# Patient Record
Sex: Female | Born: 1935 | Race: White | Hispanic: No | Smoking: Never smoker
Health system: Southern US, Community
[De-identification: ages and names within clinical notes are randomized; demographics above are authoritative.]

## PROBLEM LIST (undated history)

## (undated) DIAGNOSIS — I10 Essential (primary) hypertension: Secondary | ICD-10-CM

## (undated) DIAGNOSIS — Z598 Other problems related to housing and economic circumstances: Secondary | ICD-10-CM

## (undated) DIAGNOSIS — M8580 Other specified disorders of bone density and structure, unspecified site: Secondary | ICD-10-CM

## (undated) DIAGNOSIS — K5730 Diverticulosis of large intestine without perforation or abscess without bleeding: Secondary | ICD-10-CM

## (undated) DIAGNOSIS — F419 Anxiety disorder, unspecified: Secondary | ICD-10-CM

## (undated) DIAGNOSIS — Z5989 Other problems related to housing and economic circumstances: Secondary | ICD-10-CM

## (undated) DIAGNOSIS — F329 Major depressive disorder, single episode, unspecified: Secondary | ICD-10-CM

## (undated) DIAGNOSIS — Z5987 Material hardship due to limited financial resources, not elsewhere classified: Secondary | ICD-10-CM

## (undated) DIAGNOSIS — F32A Depression, unspecified: Secondary | ICD-10-CM

## (undated) DIAGNOSIS — D126 Benign neoplasm of colon, unspecified: Secondary | ICD-10-CM

## (undated) HISTORY — DX: Anxiety disorder, unspecified: F41.9

## (undated) HISTORY — DX: Benign neoplasm of colon, unspecified: D12.6

## (undated) HISTORY — DX: Depression, unspecified: F32.A

## (undated) HISTORY — DX: Major depressive disorder, single episode, unspecified: F32.9

## (undated) HISTORY — DX: Other problems related to housing and economic circumstances: Z59.8

## (undated) HISTORY — DX: Other problems related to housing and economic circumstances: Z59.89

## (undated) HISTORY — DX: Essential (primary) hypertension: I10

## (undated) HISTORY — PX: ABDOMINAL HYSTERECTOMY: SHX81

## (undated) HISTORY — DX: Other specified disorders of bone density and structure, unspecified site: M85.80

## (undated) HISTORY — DX: Material hardship due to limited financial resources, not elsewhere classified: Z59.87

## (undated) HISTORY — DX: Diverticulosis of large intestine without perforation or abscess without bleeding: K57.30

---

## 2000-04-14 ENCOUNTER — Ambulatory Visit (HOSPITAL_COMMUNITY): Admission: RE | Admit: 2000-04-14 | Discharge: 2000-04-14 | Payer: Self-pay

## 2000-04-21 ENCOUNTER — Ambulatory Visit (HOSPITAL_COMMUNITY): Admission: RE | Admit: 2000-04-21 | Discharge: 2000-04-21 | Payer: Self-pay | Admitting: Internal Medicine

## 2000-04-21 ENCOUNTER — Encounter: Admission: RE | Admit: 2000-04-21 | Discharge: 2000-04-21 | Payer: Self-pay | Admitting: Internal Medicine

## 2001-10-04 ENCOUNTER — Encounter: Admission: RE | Admit: 2001-10-04 | Discharge: 2001-10-04 | Payer: Self-pay | Admitting: Internal Medicine

## 2001-10-28 ENCOUNTER — Encounter: Admission: RE | Admit: 2001-10-28 | Discharge: 2001-10-28 | Payer: Self-pay | Admitting: Internal Medicine

## 2001-11-25 ENCOUNTER — Encounter: Admission: RE | Admit: 2001-11-25 | Discharge: 2001-11-25 | Payer: Self-pay | Admitting: Internal Medicine

## 2002-02-15 ENCOUNTER — Encounter: Admission: RE | Admit: 2002-02-15 | Discharge: 2002-02-15 | Payer: Self-pay | Admitting: Internal Medicine

## 2002-02-22 ENCOUNTER — Encounter: Admission: RE | Admit: 2002-02-22 | Discharge: 2002-02-22 | Payer: Self-pay | Admitting: Internal Medicine

## 2002-02-27 ENCOUNTER — Encounter: Admission: RE | Admit: 2002-02-27 | Discharge: 2002-02-27 | Payer: Self-pay | Admitting: Internal Medicine

## 2002-03-06 ENCOUNTER — Encounter: Admission: RE | Admit: 2002-03-06 | Discharge: 2002-03-06 | Payer: Self-pay | Admitting: Internal Medicine

## 2002-03-22 ENCOUNTER — Encounter: Admission: RE | Admit: 2002-03-22 | Discharge: 2002-03-22 | Payer: Self-pay | Admitting: Internal Medicine

## 2003-05-21 ENCOUNTER — Encounter: Admission: RE | Admit: 2003-05-21 | Discharge: 2003-05-21 | Payer: Self-pay | Admitting: Internal Medicine

## 2004-01-03 ENCOUNTER — Encounter: Admission: RE | Admit: 2004-01-03 | Discharge: 2004-01-03 | Payer: Self-pay | Admitting: Internal Medicine

## 2005-09-17 ENCOUNTER — Ambulatory Visit: Payer: Self-pay | Admitting: Internal Medicine

## 2005-10-01 ENCOUNTER — Ambulatory Visit: Payer: Self-pay | Admitting: Internal Medicine

## 2005-10-28 ENCOUNTER — Ambulatory Visit: Payer: Self-pay | Admitting: Internal Medicine

## 2006-08-23 ENCOUNTER — Encounter (INDEPENDENT_AMBULATORY_CARE_PROVIDER_SITE_OTHER): Payer: Self-pay | Admitting: Internal Medicine

## 2006-08-23 ENCOUNTER — Ambulatory Visit: Payer: Self-pay | Admitting: Internal Medicine

## 2006-08-23 ENCOUNTER — Ambulatory Visit (HOSPITAL_COMMUNITY): Admission: RE | Admit: 2006-08-23 | Discharge: 2006-08-23 | Payer: Self-pay | Admitting: *Deleted

## 2006-08-23 DIAGNOSIS — F329 Major depressive disorder, single episode, unspecified: Secondary | ICD-10-CM

## 2006-08-23 DIAGNOSIS — I10 Essential (primary) hypertension: Secondary | ICD-10-CM | POA: Insufficient documentation

## 2006-08-23 DIAGNOSIS — R079 Chest pain, unspecified: Secondary | ICD-10-CM | POA: Insufficient documentation

## 2006-08-23 DIAGNOSIS — J329 Chronic sinusitis, unspecified: Secondary | ICD-10-CM | POA: Insufficient documentation

## 2006-08-23 DIAGNOSIS — F411 Generalized anxiety disorder: Secondary | ICD-10-CM | POA: Insufficient documentation

## 2006-08-23 LAB — CONVERTED CEMR LAB
BUN: 26 mg/dL — ABNORMAL HIGH (ref 6–23)
CO2: 27 meq/L (ref 19–32)
Calcium: 9.3 mg/dL (ref 8.4–10.5)
Chloride: 103 meq/L (ref 96–112)
Creatinine, Ser: 0.84 mg/dL (ref 0.40–1.20)
Glucose, Bld: 138 mg/dL — ABNORMAL HIGH (ref 70–99)
HCT: 45.3 % (ref 36.0–46.0)
Hemoglobin: 15.5 g/dL — ABNORMAL HIGH (ref 12.0–15.0)
MCHC: 34.2 g/dL (ref 30.0–36.0)
MCV: 89.5 fL (ref 78.0–100.0)
Platelets: 285 10*3/uL (ref 150–400)
Potassium: 4 meq/L (ref 3.5–5.3)
RBC: 5.06 M/uL (ref 3.87–5.11)
RDW: 13.5 % (ref 11.5–14.0)
Sodium: 143 meq/L (ref 135–145)
WBC: 8.8 10*3/uL (ref 4.0–10.5)

## 2006-08-31 ENCOUNTER — Ambulatory Visit (HOSPITAL_COMMUNITY): Admission: RE | Admit: 2006-08-31 | Discharge: 2006-08-31 | Payer: Self-pay | Admitting: Internal Medicine

## 2006-08-31 ENCOUNTER — Encounter (INDEPENDENT_AMBULATORY_CARE_PROVIDER_SITE_OTHER): Payer: Self-pay | Admitting: Cardiology

## 2006-09-06 ENCOUNTER — Encounter (INDEPENDENT_AMBULATORY_CARE_PROVIDER_SITE_OTHER): Payer: Self-pay | Admitting: Internal Medicine

## 2006-09-06 ENCOUNTER — Ambulatory Visit: Payer: Self-pay | Admitting: Hospitalist

## 2006-09-07 LAB — CONVERTED CEMR LAB
Calcium: 8.9 mg/dL (ref 8.4–10.5)
Creatinine, Ser: 0.83 mg/dL (ref 0.40–1.20)
Sodium: 140 meq/L (ref 135–145)

## 2006-09-23 ENCOUNTER — Ambulatory Visit: Payer: Self-pay | Admitting: Internal Medicine

## 2006-09-27 ENCOUNTER — Encounter: Payer: Self-pay | Admitting: Licensed Clinical Social Worker

## 2006-09-29 ENCOUNTER — Ambulatory Visit (HOSPITAL_COMMUNITY): Admission: RE | Admit: 2006-09-29 | Discharge: 2006-09-29 | Payer: Self-pay | Admitting: Internal Medicine

## 2006-10-07 ENCOUNTER — Encounter (INDEPENDENT_AMBULATORY_CARE_PROVIDER_SITE_OTHER): Payer: Self-pay | Admitting: Pulmonary Disease

## 2006-10-07 ENCOUNTER — Ambulatory Visit: Payer: Self-pay | Admitting: Internal Medicine

## 2006-10-07 LAB — CONVERTED CEMR LAB
Chloride: 104 meq/L (ref 96–112)
Potassium: 4.1 meq/L (ref 3.5–5.3)
Sodium: 142 meq/L (ref 135–145)

## 2006-10-12 ENCOUNTER — Telehealth: Payer: Self-pay | Admitting: Licensed Clinical Social Worker

## 2006-10-18 ENCOUNTER — Encounter: Payer: Self-pay | Admitting: Licensed Clinical Social Worker

## 2006-10-21 ENCOUNTER — Ambulatory Visit: Payer: Self-pay | Admitting: Internal Medicine

## 2006-10-21 ENCOUNTER — Encounter (INDEPENDENT_AMBULATORY_CARE_PROVIDER_SITE_OTHER): Payer: Self-pay | Admitting: Dermatology

## 2006-10-21 LAB — CONVERTED CEMR LAB
BUN: 14 mg/dL (ref 6–23)
CO2: 24 meq/L (ref 19–32)
Chloride: 107 meq/L (ref 96–112)
Glucose, Bld: 112 mg/dL — ABNORMAL HIGH (ref 70–99)
Potassium: 3.7 meq/L (ref 3.5–5.3)
Sodium: 145 meq/L (ref 135–145)

## 2007-06-09 ENCOUNTER — Ambulatory Visit: Payer: Self-pay | Admitting: Internal Medicine

## 2007-06-09 ENCOUNTER — Encounter (INDEPENDENT_AMBULATORY_CARE_PROVIDER_SITE_OTHER): Payer: Self-pay | Admitting: *Deleted

## 2007-06-09 DIAGNOSIS — J309 Allergic rhinitis, unspecified: Secondary | ICD-10-CM | POA: Insufficient documentation

## 2007-06-09 DIAGNOSIS — R040 Epistaxis: Secondary | ICD-10-CM

## 2007-06-09 DIAGNOSIS — R000 Tachycardia, unspecified: Secondary | ICD-10-CM | POA: Insufficient documentation

## 2007-06-09 LAB — CONVERTED CEMR LAB
Albumin: 3.9 g/dL (ref 3.5–5.2)
Alkaline Phosphatase: 67 units/L (ref 39–117)
BUN: 22 mg/dL (ref 6–23)
Basophils Relative: 0 % (ref 0–1)
Calcium: 9.5 mg/dL (ref 8.4–10.5)
Creatinine, Ser: 0.81 mg/dL (ref 0.40–1.20)
Eosinophils Absolute: 0.1 10*3/uL (ref 0.0–0.7)
Eosinophils Relative: 1 % (ref 0–5)
Glucose, Bld: 97 mg/dL (ref 70–99)
HCT: 45.8 % (ref 36.0–46.0)
Lymphs Abs: 1.6 10*3/uL (ref 0.7–4.0)
MCHC: 34.5 g/dL (ref 30.0–36.0)
MCV: 88.2 fL (ref 78.0–100.0)
Platelets: 256 10*3/uL (ref 150–400)
Potassium: 4.4 meq/L (ref 3.5–5.3)
Prothrombin Time: 12.3 s (ref 11.6–15.2)
RDW: 13.6 % (ref 11.5–15.5)

## 2007-07-13 ENCOUNTER — Telehealth: Payer: Self-pay | Admitting: *Deleted

## 2007-08-31 ENCOUNTER — Encounter (INDEPENDENT_AMBULATORY_CARE_PROVIDER_SITE_OTHER): Payer: Self-pay | Admitting: Internal Medicine

## 2007-08-31 ENCOUNTER — Ambulatory Visit: Payer: Self-pay | Admitting: Internal Medicine

## 2007-08-31 ENCOUNTER — Ambulatory Visit (HOSPITAL_COMMUNITY): Admission: RE | Admit: 2007-08-31 | Discharge: 2007-08-31 | Payer: Self-pay | Admitting: Internal Medicine

## 2007-08-31 LAB — CONVERTED CEMR LAB
BUN: 19 mg/dL (ref 6–23)
Chloride: 104 meq/L (ref 96–112)
Potassium: 4.4 meq/L (ref 3.5–5.3)

## 2007-09-14 ENCOUNTER — Encounter (INDEPENDENT_AMBULATORY_CARE_PROVIDER_SITE_OTHER): Payer: Self-pay | Admitting: Internal Medicine

## 2007-09-14 ENCOUNTER — Ambulatory Visit: Payer: Self-pay | Admitting: Internal Medicine

## 2007-10-11 ENCOUNTER — Telehealth (INDEPENDENT_AMBULATORY_CARE_PROVIDER_SITE_OTHER): Payer: Self-pay | Admitting: Internal Medicine

## 2007-10-14 ENCOUNTER — Encounter (INDEPENDENT_AMBULATORY_CARE_PROVIDER_SITE_OTHER): Payer: Self-pay | Admitting: Internal Medicine

## 2007-10-14 ENCOUNTER — Ambulatory Visit: Payer: Self-pay | Admitting: *Deleted

## 2007-10-14 LAB — CONVERTED CEMR LAB
CO2: 24 meq/L (ref 19–32)
Glucose, Bld: 141 mg/dL — ABNORMAL HIGH (ref 70–99)
Potassium: 3.9 meq/L (ref 3.5–5.3)
Sodium: 141 meq/L (ref 135–145)

## 2007-10-20 ENCOUNTER — Ambulatory Visit (HOSPITAL_COMMUNITY): Admission: RE | Admit: 2007-10-20 | Discharge: 2007-10-20 | Payer: Self-pay | Admitting: *Deleted

## 2007-10-20 ENCOUNTER — Encounter (INDEPENDENT_AMBULATORY_CARE_PROVIDER_SITE_OTHER): Payer: Self-pay | Admitting: Internal Medicine

## 2007-10-20 LAB — HM MAMMOGRAPHY

## 2007-10-31 DIAGNOSIS — M949 Disorder of cartilage, unspecified: Secondary | ICD-10-CM

## 2007-10-31 DIAGNOSIS — M899 Disorder of bone, unspecified: Secondary | ICD-10-CM | POA: Insufficient documentation

## 2007-11-28 ENCOUNTER — Ambulatory Visit: Payer: Self-pay | Admitting: Internal Medicine

## 2007-11-28 ENCOUNTER — Encounter (INDEPENDENT_AMBULATORY_CARE_PROVIDER_SITE_OTHER): Payer: Self-pay | Admitting: Internal Medicine

## 2007-11-28 LAB — CONVERTED CEMR LAB
ALT: 9 units/L (ref 0–35)
Alkaline Phosphatase: 66 units/L (ref 39–117)
Basophils Absolute: 0 10*3/uL (ref 0.0–0.1)
Eosinophils Absolute: 0.2 10*3/uL (ref 0.0–0.7)
Eosinophils Relative: 2 % (ref 0–5)
HCT: 46.7 % — ABNORMAL HIGH (ref 36.0–46.0)
MCV: 90 fL (ref 78.0–100.0)
Platelets: 277 10*3/uL (ref 150–400)
RDW: 13.2 % (ref 11.5–15.5)
Sodium: 140 meq/L (ref 135–145)
Total Bilirubin: 0.5 mg/dL (ref 0.3–1.2)
Total Protein: 6.8 g/dL (ref 6.0–8.3)

## 2008-01-27 ENCOUNTER — Emergency Department (HOSPITAL_COMMUNITY): Admission: EM | Admit: 2008-01-27 | Discharge: 2008-01-27 | Payer: Self-pay | Admitting: Emergency Medicine

## 2008-06-29 ENCOUNTER — Telehealth (INDEPENDENT_AMBULATORY_CARE_PROVIDER_SITE_OTHER): Payer: Self-pay | Admitting: Internal Medicine

## 2008-10-23 ENCOUNTER — Ambulatory Visit: Payer: Self-pay | Admitting: Internal Medicine

## 2008-10-23 ENCOUNTER — Encounter (INDEPENDENT_AMBULATORY_CARE_PROVIDER_SITE_OTHER): Payer: Self-pay | Admitting: Internal Medicine

## 2008-10-23 DIAGNOSIS — H6120 Impacted cerumen, unspecified ear: Secondary | ICD-10-CM

## 2008-10-23 LAB — CONVERTED CEMR LAB
Albumin: 4.5 g/dL (ref 3.5–5.2)
Alkaline Phosphatase: 54 units/L (ref 39–117)
BUN: 19 mg/dL (ref 6–23)
Eosinophils Relative: 2 % (ref 0–5)
GFR calc Af Amer: >60 mL/min (ref >60)
GFR calc non Af Amer: >60 mL/min (ref >60)
Glucose, Bld: 102 mg/dL — ABNORMAL HIGH (ref 70–99)
HCT: 45.3 % (ref 36.0–46.0)
Hemoglobin, Urine: NEGATIVE
Leukocytes, UA: NEGATIVE
Lymphocytes Relative: 24 % (ref 12–46)
Lymphs Abs: 1.8 10*3/uL (ref 0.7–4.0)
Neutro Abs: 4.8 10*3/uL (ref 1.7–7.7)
Neutrophils Relative %: 64 % (ref 43–77)
Nitrite: NEGATIVE
Platelets: 258 10*3/uL (ref 150–400)
Potassium: 3.8 meq/L (ref 3.5–5.3)
Protein, ur: NEGATIVE mg/dL
TSH: 1.27 microintl units/mL (ref 0.350–4.500)
Total Bilirubin: 0.6 mg/dL (ref 0.3–1.2)
Urobilinogen, UA: 0.2 (ref 0.0–1.0)
WBC: 7.6 10*3/uL (ref 4.0–10.5)

## 2008-10-24 ENCOUNTER — Encounter: Payer: Self-pay | Admitting: Licensed Clinical Social Worker

## 2008-12-14 ENCOUNTER — Emergency Department (HOSPITAL_COMMUNITY): Admission: EM | Admit: 2008-12-14 | Discharge: 2008-12-14 | Payer: Self-pay | Admitting: Emergency Medicine

## 2008-12-15 ENCOUNTER — Emergency Department (HOSPITAL_COMMUNITY): Admission: EM | Admit: 2008-12-15 | Discharge: 2008-12-15 | Payer: Self-pay | Admitting: Emergency Medicine

## 2009-03-18 ENCOUNTER — Ambulatory Visit: Payer: Self-pay | Admitting: Internal Medicine

## 2009-04-30 ENCOUNTER — Telehealth: Payer: Self-pay | Admitting: Internal Medicine

## 2009-06-07 ENCOUNTER — Telehealth: Payer: Self-pay | Admitting: Internal Medicine

## 2009-06-18 ENCOUNTER — Ambulatory Visit: Payer: Self-pay | Admitting: Internal Medicine

## 2009-06-18 LAB — CONVERTED CEMR LAB
ALT: 10 units/L (ref 0–35)
Alkaline Phosphatase: 57 units/L (ref 39–117)
Creatinine, Ser: 0.65 mg/dL (ref 0.40–1.20)
Sodium: 139 meq/L (ref 135–145)
Total Bilirubin: 0.4 mg/dL (ref 0.3–1.2)
Total Protein: 6.9 g/dL (ref 6.0–8.3)

## 2009-07-25 ENCOUNTER — Telehealth: Payer: Self-pay | Admitting: Licensed Clinical Social Worker

## 2009-07-30 ENCOUNTER — Encounter: Payer: Self-pay | Admitting: Licensed Clinical Social Worker

## 2009-07-30 ENCOUNTER — Ambulatory Visit (HOSPITAL_COMMUNITY): Admission: RE | Admit: 2009-07-30 | Discharge: 2009-07-30 | Payer: Self-pay | Admitting: Internal Medicine

## 2009-07-30 ENCOUNTER — Ambulatory Visit: Payer: Self-pay | Admitting: Infectious Diseases

## 2009-07-30 DIAGNOSIS — R42 Dizziness and giddiness: Secondary | ICD-10-CM

## 2009-08-09 ENCOUNTER — Ambulatory Visit: Payer: Self-pay | Admitting: Internal Medicine

## 2009-08-12 DIAGNOSIS — E785 Hyperlipidemia, unspecified: Secondary | ICD-10-CM | POA: Insufficient documentation

## 2009-08-12 LAB — CONVERTED CEMR LAB
CO2: 24 meq/L (ref 19–32)
Calcium: 9.5 mg/dL (ref 8.4–10.5)
Chloride: 101 meq/L (ref 96–112)
Creatinine, Ser: 0.8 mg/dL (ref 0.40–1.20)
Glucose, Bld: 99 mg/dL (ref 70–99)
HDL: 34 mg/dL — ABNORMAL LOW (ref >39)
LDL Cholesterol: 83 mg/dL (ref 0–99)
Total CHOL/HDL Ratio: 4.5

## 2009-08-16 ENCOUNTER — Ambulatory Visit: Payer: Self-pay | Admitting: Internal Medicine

## 2009-08-16 ENCOUNTER — Encounter: Payer: Self-pay | Admitting: Licensed Clinical Social Worker

## 2010-06-08 ENCOUNTER — Encounter: Payer: Self-pay | Admitting: Internal Medicine

## 2010-06-09 ENCOUNTER — Encounter: Payer: Self-pay | Admitting: Family Medicine
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
## 2010-06-19 NOTE — Assessment & Plan Note (Signed)
Summary: Social Work  Social Work. 40 minutes.   Counseling and support.  Colie is now officially out of the shelter and sleeping in the evenings at her condemned home and using her car during the day.   We primarily discussed today her trying to hang on to her home versus letting it go and devoting her money and energy to finding a safe and clean apartment.   The reality is she will not be able to find a place to live and afford the myriad of repairs needed on her home to bring it to code.  She also has issues with two neighbors who have not made it easy for [facility]. Anica knows it is not a good situation but is not ready to give up on the house which has been in the family for many years.    Rafaela reports that she is eating lunch at [employer] or [employer].  She is going to lunch this PM with her godson who is very supportive of her but is inclined to think that the house may not be worth renovating.    SW will continue counseling and support as needed.
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
## 2010-06-19 NOTE — Progress Notes (Signed)
Summary: refill/gg  Phone Note Refill Request  on June 07, 2009 12:38 PM  Refills Requested: Medication #1:  LISINOPRIL-HYDROCHLOROTHIAZIDE 20-25 MG TABS Take 1 tablet by mouth once a day   Dosage confirmed as above?Dosage Confirmed   Supply Requested: 1 year  Method Requested: Electronic Initial call taken by: Merrie Roof RN,  June 07, 2009 12:38 PM    Prescriptions: LISINOPRIL-HYDROCHLOROTHIAZIDE 20-25 MG TABS (LISINOPRIL-HYDROCHLOROTHIAZIDE) Take 1 tablet by mouth once a day  #30 x 11   Entered and Authorized by:   Doneen Poisson MD   Signed by:   Doneen Poisson MD on 06/07/2009   Method used:   [facility]        [facility] [address] [phone]* (retail)       [address]       [place], [place]  [address]       Ph: [phone] or [phone]       Fax: [phone]   RxID:   [id number]
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
## 2010-08-24 LAB — RAPID URINE DRUG SCREEN, HOSP PERFORMED
Amphetamines: NOT DETECTED
Barbiturates: NOT DETECTED
Benzodiazepines: NOT DETECTED
Opiates: NOT DETECTED

## 2010-08-24 LAB — CBC
HCT: 43.8 % (ref 36.0–46.0)
Hemoglobin: 15.2 g/dL — ABNORMAL HIGH (ref 12.0–15.0)
MCHC: 34.6 g/dL (ref 30.0–36.0)
MCV: 90.6 fL (ref 78.0–100.0)
Platelets: 251 10*3/uL (ref 150–400)
RBC: 4.84 MIL/uL (ref 3.87–5.11)
RDW: 13.9 % (ref 11.5–15.5)
WBC: 8.2 10*3/uL (ref 4.0–10.5)

## 2010-08-24 LAB — ETHANOL: Alcohol, Ethyl (B): 7 mg/dL (ref 0–10)

## 2010-08-24 LAB — DIFFERENTIAL
Basophils Relative: 1 % (ref 0–1)
Eosinophils Absolute: 0.1 10*3/uL (ref 0.0–0.7)
Monocytes Relative: 11 % (ref 3–12)
Neutro Abs: 6 10*3/uL (ref 1.7–7.7)
Neutrophils Relative %: 73 % (ref 43–77)

## 2010-08-24 LAB — URINALYSIS, ROUTINE W REFLEX MICROSCOPIC
Bilirubin Urine: NEGATIVE
Hgb urine dipstick: NEGATIVE
Ketones, ur: 15 mg/dL — AB
Protein, ur: NEGATIVE mg/dL
Urobilinogen, UA: 1 mg/dL (ref 0.0–1.0)

## 2010-08-24 LAB — BASIC METABOLIC PANEL
BUN: 16 mg/dL (ref 6–23)
Calcium: 9.2 mg/dL (ref 8.4–10.5)
Creatinine, Ser: 0.81 mg/dL (ref 0.4–1.2)
GFR calc Af Amer: >60 mL/min (ref >60)

## 2010-08-24 LAB — URINE MICROSCOPIC-ADD ON

## 2010-09-01 ENCOUNTER — Other Ambulatory Visit: Payer: Self-pay | Admitting: *Deleted

## 2010-09-01 MED ORDER — ARIPIPRAZOLE 5 MG PO TABS: 5.0000 mg | ORAL_TABLET | Freq: Every day | ORAL | Status: DC

## 2010-09-01 MED ORDER — FLUOXETINE HCL 40 MG PO CAPS: 40.0000 mg | ORAL_CAPSULE | Freq: Every day | ORAL | Status: DC

## 2010-09-30 ENCOUNTER — Telehealth: Payer: Self-pay | Admitting: *Deleted

## 2010-09-30 NOTE — Telephone Encounter (Signed)
Pt calls and states she is very depressed lately, she is ask if she wants to harm herself, she answers "no, no", she is ask if she would like to do harm to others, she answers "no, nothing like that" she states this has been going on for quite awhile but is getting worse and requests an appt, she does seem sad and worried as we speak. An appt is made w/ dr Tonny Branch for 5/16 at 1015, she is agreeable and appt is per sharonb.

## 2010-09-30 NOTE — Telephone Encounter (Signed)
Agree with scheduling an appointment tomorrow morning at her request.

## 2010-10-01 ENCOUNTER — Ambulatory Visit: Payer: Self-pay | Admitting: Internal Medicine

## 2010-10-07 ENCOUNTER — Ambulatory Visit (INDEPENDENT_AMBULATORY_CARE_PROVIDER_SITE_OTHER): Payer: Medicare Other | Admitting: Internal Medicine

## 2010-10-07 VITALS — BP 122/78 | HR 88 | Temp 97.8°F | Ht 61.75 in | Wt 130.2 lb

## 2010-10-07 DIAGNOSIS — F329 Major depressive disorder, single episode, unspecified: Secondary | ICD-10-CM

## 2010-10-07 DIAGNOSIS — I10 Essential (primary) hypertension: Secondary | ICD-10-CM

## 2010-10-07 DIAGNOSIS — Z1211 Encounter for screening for malignant neoplasm of colon: Secondary | ICD-10-CM

## 2010-10-07 DIAGNOSIS — H6120 Impacted cerumen, unspecified ear: Secondary | ICD-10-CM

## 2010-10-07 MED ORDER — ARIPIPRAZOLE 5 MG PO TABS: 5.0000 mg | ORAL_TABLET | Freq: Every day | ORAL | Status: DC

## 2010-10-07 MED ORDER — FLUOXETINE HCL 40 MG PO CAPS: 40.0000 mg | ORAL_CAPSULE | Freq: Every day | ORAL | Status: DC

## 2010-10-07 NOTE — Patient Instructions (Addendum)
1. Restart your Abilify 5 mg tablets take 1 tablet daily  2. Continue taking your Prozac every day.  3. We will make an appointment with GI to have your colonoscopy done.  4. Follow up with me in the end of next week.

## 2010-10-07 NOTE — Progress Notes (Signed)
Subjective:    Patient ID: [name] Barron, female    DOB: January 19, 1934, 77 y.o.   MRN: [id number]  HPI  [name] Barron is a 77 year old woman who presents to clinic today complaining of feeling more depressed lately. She states that she has been on medications for sometime and that she ran out of her Abilify about 3-4 weeks ago.  She also brought her Prozac bottle that was filled on 08/21/10 and still has approximately half of the 30 day supply in it.  She has had problems with decreased concentration, not being able to or wanting to get out of bed and increased depressive mood.  She denies SI, HI, or active plan.  She has had a history of depression and had been on the medications above since she was having problems with her house being condemned last year.  She has been living at an independent senior living since November 2011 and states that she doesn't like it much because of her neighbors.    She has several other health maintenance items that need to be addressed including a screening colonoscopy.  She is willing to under go that today.  She does have a history of polyp removal during her previous colonoscopy so she would like to get that done.    She also states that she has had problems with leaking urine that has been going for a while.  She states that it happens mostly when she sneezes or cough but does occasionally happen for no reason.  She denies any dysuria, increased frequency, fevers, chills, blood in her urine, or abdominal pain.  She has a history of a hysterectomy for uterine fibroids.    Review of Systems    Constitutional: Denies fever, chills, diaphoresis, appetite change and fatigue.  HEENT: Denies photophobia, eye pain, redness, hearing loss, ear pain, congestion, sore throat, rhinorrhea, sneezing, mouth sores, trouble swallowing, neck pain, neck stiffness and tinnitus.   Respiratory: Denies SOB, DOE, cough, chest tightness,  and wheezing.   Cardiovascular: Denies chest pain,  palpitations and leg swelling.  Gastrointestinal: Denies nausea, vomiting, abdominal pain, diarrhea, constipation, blood in stool and abdominal distention.  Genitourinary: Denies dysuria, urgency, frequency, hematuria, flank pain and difficulty urinating.  Musculoskeletal: Denies myalgias, back pain, joint swelling, arthralgias and gait problem.  Skin: Denies pallor, rash and wound.  Neurological: Denies dizziness, seizures, syncope, weakness, light-headedness, numbness and headaches.  Hematological: Denies adenopathy. Easy bruising, personal or family bleeding history  Psychiatric/Behavioral: Positive for mood changes and sleep disturbance.  Denies suicidal ideation, confusion, nervousness,  and agitation  Objective:   Physical Exam    Constitutional: Vital signs reviewed.  Patient is an elderly, unkept woman in no acute distress and cooperative with exam. Alert and oriented x3.  Head: Normocephalic and atraumatic Ear: cerumen impaction noted bilaterally.   Mouth: no erythema or exudates, MMM, poor dentition.  Eyes: PERRL, EOMI, conjunctivae normal, No scleral icterus.  Neck: Supple, Trachea midline normal ROM, No JVD, mass, thyromegaly, or carotid bruit present.  Cardiovascular: RRR, S1 normal, S2 normal, no MRG, pulses symmetric and intact bilaterally Pulmonary/Chest: CTAB, no wheezes, rales, or rhonchi Abdominal: Soft. Non-tender, non-distended, bowel sounds are normal, no masses, organomegaly, or guarding present.  GU: no CVA tenderness Musculoskeletal: No joint deformities, erythema, or stiffness, ROM full and no nontender Hematology: no cervical, inginal, or axillary adenopathy.  Neurological: A&O x3, Strenght is normal and symmetric bilaterally, cranial nerve II-XII are grossly intact, no focal motor deficit, sensory intact to light touch  bilaterally.  Skin: Warm, dry and intact. No rash, cyanosis, or clubbing.  Psychiatric: Depressed mood and flat affect. Speech is slowed but   behavior is normal. Judgment and thought content normal. Cognition and memory are normal.    Assessment & Plan:

## 2010-10-10 ENCOUNTER — Telehealth: Payer: Self-pay | Admitting: Licensed Clinical Social Worker

## 2010-10-10 NOTE — Assessment & Plan Note (Signed)
Noted on exam today.  When asked she states that she can't hear very well out of either ear.  We cleaned her ears out today and she states that she could hear much better.

## 2010-10-10 NOTE — Telephone Encounter (Signed)
Called [name] Barron and she will come in on Tuesday 5/29 at 10:00 AM for planning re: her depression.

## 2010-10-10 NOTE — Assessment & Plan Note (Signed)
[name] Barron continues to struggle with depression and has now decompensated because she has been without her medications for sometime.  She is not consistently taking her Prozac either.  She is not liking where she is living currently but had previously been homeless because her home was condemned and she didn't have money to fix it.  We will plan on restarting her medications now and have her follow up in 2 weeks to see how she is feeling.  We will also have her follow up with Dorothe Pea for her depression and help with if she is able to find other housing that she likes better.

## 2010-10-10 NOTE — Assessment & Plan Note (Signed)
BP Readings from Last 3 Encounters:  10/07/10 122/78  08/16/09 137/69  06/18/09 160/88   Blood pressure is excellent today.  We will refill her medications and continue to monitor.Marland Kitchen

## 2010-10-14 ENCOUNTER — Ambulatory Visit: Payer: Medicare Other | Admitting: Licensed Clinical Social Worker

## 2010-10-14 NOTE — Telephone Encounter (Signed)
The patient [facility] for social work appmt this AM.  Sending info in the mail.

## 2010-10-15 ENCOUNTER — Ambulatory Visit (INDEPENDENT_AMBULATORY_CARE_PROVIDER_SITE_OTHER): Payer: Medicare Other | Admitting: Internal Medicine

## 2010-10-15 ENCOUNTER — Encounter: Payer: Self-pay | Admitting: Internal Medicine

## 2010-10-15 VITALS — BP 132/78 | HR 93 | Temp 98.8°F | Resp 20 | Ht 61.5 in | Wt 126.7 lb

## 2010-10-15 DIAGNOSIS — F329 Major depressive disorder, single episode, unspecified: Secondary | ICD-10-CM

## 2010-10-15 DIAGNOSIS — F3289 Other specified depressive episodes: Secondary | ICD-10-CM

## 2010-10-15 DIAGNOSIS — I10 Essential (primary) hypertension: Secondary | ICD-10-CM

## 2010-10-15 MED ORDER — LISINOPRIL-HYDROCHLOROTHIAZIDE 20-25 MG PO TABS: 1.0000 | ORAL_TABLET | Freq: Every day | ORAL | Status: DC

## 2010-10-15 NOTE — Progress Notes (Signed)
  Subjective:    Patient ID: [name] Barron, female    DOB: 1934-03-07, 77 y.o.   MRN: [id number]  HPI  [name] Barron is a 77 year old woman who presents today for follow up from her previous visit.  She states she has been taking the Prozac as prescribed.  She took the Abilify for 2 days and could not sleep at night so she stopped taking it after the first two days.  She states that she has not tried taking it in the morning.  She states she doesn't feel any different since taking the Prozac.  She denies suicidal and homicidal ideations.  She has been sleeping better at night since stopping the Abilify.    Review of Systems    Constitutional: Denies fever, chills, diaphoresis, appetite change and fatigue.  HEENT: Denies photophobia, eye pain, redness, hearing loss, ear pain, congestion, sore throat, rhinorrhea, sneezing, mouth sores, trouble swallowing, neck pain, neck stiffness and tinnitus.   Respiratory: Denies SOB, DOE, cough, chest tightness,  and wheezing.   Cardiovascular: Denies chest pain, palpitations and leg swelling.  Gastrointestinal: Denies nausea, vomiting, abdominal pain, diarrhea, constipation, blood in stool and abdominal distention.  Genitourinary: Denies dysuria, urgency, frequency, hematuria, flank pain and difficulty urinating.  Musculoskeletal: Denies myalgias, back pain, joint swelling, arthralgias and gait problem.  Skin: Denies pallor, rash and wound.  Neurological: Denies dizziness, seizures, syncope, weakness, light-headedness, numbness and headaches.  Hematological: Denies adenopathy. Easy bruising, personal or family bleeding history  Psychiatric/Behavioral: Denies suicidal ideation, mood changes, confusion, nervousness, sleep disturbance and agitation   Objective:   Physical Exam    Constitutional: Vital signs reviewed.  Patient is an elderly woman in no acute distress and cooperative with exam. Alert and oriented x3.  Head: Normocephalic and atraumatic Ear: TM  normal bilaterally Mouth: no erythema or exudates, MMM Eyes: PERRL, EOMI, conjunctivae normal, No scleral icterus.  Neck: Supple, Trachea midline normal ROM, No JVD, mass, thyromegaly, or carotid bruit present.  Cardiovascular: RRR, S1 normal, S2 normal, no MRG, pulses symmetric and intact bilaterally Pulmonary/Chest: CTAB, no wheezes, rales, or rhonchi Abdominal: Soft. Non-tender, non-distended, bowel sounds are normal, no masses, organomegaly, or guarding present.  GU: no CVA tenderness Musculoskeletal: No joint deformities, erythema, or stiffness, ROM full and no nontender Hematology: no cervical, inginal, or axillary adenopathy.  Neurological: A&O x3, Strenght is normal and symmetric bilaterally, cranial nerve II-XII are grossly intact, no focal motor deficit, sensory intact to light touch bilaterally.  Skin: Warm, dry and intact. No rash, cyanosis, or clubbing.  Psychiatric: Mood is improved since last visit, more interactive and more range of emotion in her affect. speech and behavior is normal. Judgment and thought content normal. Cognition and memory are normal.    Assessment & Plan:

## 2010-10-15 NOTE — Patient Instructions (Addendum)
Continue the Fluoxetine (Prozac) 40 mg daily in the morning.  Take the Abilify 5 mg tablets daily in the morning as well.   If you have problems with the trouble sleeping after restarting the Abilify please call [name] Barron.  Have the doctor for [facility] send [name] Barron a copy of his records and make sure your bring your medications to your follow up appointment.

## 2010-10-27 NOTE — Assessment & Plan Note (Addendum)
She is mildly improved today in the clinic with more eye contact, more interaction with me during the interview.  She has had no side effects from the Prozac but the Abilify kept her from sleeping.  We will have her try to take it in the AM to see if that helps her have energy for her day and if it continues to cause problems we will likely just stop it and adjust the dose of her Prozac.  She was counseled that full effect from the medication is likely not to be seen for at least 6-8 weeks after starting it.  She is encouraged to continue taking them daily and to call us if she has any more problems with the medications. Marland Kitchen

## 2010-10-27 NOTE — Assessment & Plan Note (Signed)
BP Readings from Last 3 Encounters:  10/15/10 132/78  10/07/10 122/78  08/16/09 137/69   Blood pressure is mildly above her last value but still well within her goal.  We will continue to monitor her.

## 2010-11-07 ENCOUNTER — Encounter: Payer: Medicare Other | Admitting: Internal Medicine

## 2010-11-10 ENCOUNTER — Encounter: Payer: Self-pay | Admitting: Internal Medicine

## 2010-11-22 ENCOUNTER — Inpatient Hospital Stay (INDEPENDENT_AMBULATORY_CARE_PROVIDER_SITE_OTHER)
Admission: RE | Admit: 2010-11-22 | Discharge: 2010-11-22 | Disposition: A | Discharge status: Home / Self Care | Payer: Medicare Other | Source: Ambulatory Visit | Attending: Family Medicine | Admitting: Family Medicine

## 2010-11-22 DIAGNOSIS — J029 Acute pharyngitis, unspecified: Secondary | ICD-10-CM

## 2010-11-22 DIAGNOSIS — I10 Essential (primary) hypertension: Secondary | ICD-10-CM

## 2010-12-17 ENCOUNTER — Encounter: Payer: Medicare Other | Admitting: Internal Medicine

## 2010-12-18 ENCOUNTER — Encounter: Payer: Self-pay | Admitting: Internal Medicine

## 2010-12-18 ENCOUNTER — Inpatient Hospital Stay (INDEPENDENT_AMBULATORY_CARE_PROVIDER_SITE_OTHER)
Admission: RE | Admit: 2010-12-18 | Discharge: 2010-12-18 | Disposition: A | Discharge status: Home / Self Care | Payer: Medicare Other | Source: Ambulatory Visit | Attending: Emergency Medicine | Admitting: Emergency Medicine

## 2010-12-18 ENCOUNTER — Ambulatory Visit (INDEPENDENT_AMBULATORY_CARE_PROVIDER_SITE_OTHER): Payer: Medicare Other | Admitting: Internal Medicine

## 2010-12-18 VITALS — BP 149/88 | HR 91 | Temp 97.9°F | Ht 62.0 in | Wt 130.6 lb

## 2010-12-18 DIAGNOSIS — F419 Anxiety disorder, unspecified: Secondary | ICD-10-CM

## 2010-12-18 DIAGNOSIS — I10 Essential (primary) hypertension: Secondary | ICD-10-CM

## 2010-12-18 DIAGNOSIS — F411 Generalized anxiety disorder: Secondary | ICD-10-CM

## 2010-12-18 DIAGNOSIS — F329 Major depressive disorder, single episode, unspecified: Secondary | ICD-10-CM

## 2010-12-18 DIAGNOSIS — Z23 Encounter for immunization: Secondary | ICD-10-CM

## 2010-12-18 DIAGNOSIS — J309 Allergic rhinitis, unspecified: Secondary | ICD-10-CM

## 2010-12-18 DIAGNOSIS — IMO0002 Reserved for concepts with insufficient information to code with codable children: Secondary | ICD-10-CM

## 2010-12-18 DIAGNOSIS — F32A Depression, unspecified: Secondary | ICD-10-CM

## 2010-12-18 DIAGNOSIS — Z0000 Encounter for general adult medical examination without abnormal findings: Secondary | ICD-10-CM

## 2010-12-18 LAB — CBC
Hemoglobin: 15.3 g/dL — ABNORMAL HIGH (ref 12.0–15.0)
MCH: 30.5 pg (ref 26.0–34.0)
MCHC: 33.3 g/dL (ref 30.0–36.0)
Platelets: 242 10*3/uL (ref 150–400)

## 2010-12-18 LAB — COMPREHENSIVE METABOLIC PANEL
AST: 13 U/L (ref 0–37)
Albumin: 4 g/dL (ref 3.5–5.2)
Alkaline Phosphatase: 56 U/L (ref 39–117)
Potassium: 4.2 mEq/L (ref 3.5–5.3)
Sodium: 140 mEq/L (ref 135–145)
Total Protein: 6.6 g/dL (ref 6.0–8.3)

## 2010-12-18 LAB — TSH: TSH: 1.625 u[IU]/mL (ref 0.350–4.500)

## 2010-12-18 MED ORDER — CETIRIZINE HCL 10 MG PO CHEW: 10.0000 mg | CHEWABLE_TABLET | Freq: Every day | ORAL | Status: DC

## 2010-12-18 NOTE — Patient Instructions (Signed)
   Please follow-up at the clinic in 1 month, at which time we will reevaluate your blood pressure.  Zyrtec has been sent to your pharmacy.  If you have been started on new medication(s), and you develop throat closing, tongue swelling, rash, please stop the medication and call the clinic at [phone] and go to the ER.  If symptoms worsen, or new symptoms arise, please call the clinic or go to the ER.  Please bring all of your medications in a bag to your next visit.

## 2010-12-18 NOTE — Assessment & Plan Note (Signed)
-   Will check CBC per request of her psychiatrist, although she notably has had no evidence of anemia in the past. - Will check CMET per request of her psychiatrist, to evaluate for metabolic derangements that may be resultant from her Abilify. - Will request psychiatrist records.

## 2010-12-18 NOTE — Assessment & Plan Note (Signed)
The patient years complaints, in addition to the itchy watery eyes, and itchy nose which she describes are most consistent with an allergic rhinitis type picture. Of note she has been unable to afford her Allegra. There is no evidence of cerumen impaction in her ears.  - Will send in a prescription for Zyrtec, although this can also be purchased over-the-counter if less expensive for the patient.

## 2010-12-18 NOTE — Assessment & Plan Note (Signed)
-   Will check TSH per request of her psychiatrist. - Will require psychiatrist clinic records.

## 2010-12-18 NOTE — Assessment & Plan Note (Addendum)
Lab Results  Component Value Date   NA 139 08/09/2009   K 4.3 08/09/2009   CL 101 08/09/2009   CO2 24 08/09/2009   BUN 25* 08/09/2009   CREATININE 0.80 08/09/2009    BP Readings from Last 3 Encounters:  12/18/10 149/88  10/15/10 132/78  10/07/10 122/78    Assessment: Hypertension control:  Recheck blood pressure was 142/84. She has isolated elevated blood pressure, with prior well-controlled blood pressures on numerous occasions.  Progress toward goals:  deteriorated, Isolated elevated blood pressure. Barriers to meeting goals:  no barriers identified  Plan: Hypertension treatment:  continue current medications, as this is an isolated incident, with prior well-controlled blood pressures, I will not change her medications at this time. In the future we can consider escalating lisinopril dosage if indicated. The patient is recommended to have close followup with her PCP for continued monitoring and adjustment of therapy as indicated.

## 2010-12-18 NOTE — Assessment & Plan Note (Deleted)
Lab Results  Component Value Date   NA 139 08/09/2009   K 4.3 08/09/2009   CL 101 08/09/2009   CO2 24 08/09/2009   BUN 25* 08/09/2009   CREATININE 0.80 08/09/2009    BP Readings from Last 3 Encounters:  12/18/10 149/88  10/15/10 132/78  10/07/10 122/78    Assessment: Hypertension control:  Recheck blood pressure was 142/84. She has isolated elevated blood pressure, with prior well-controlled blood pressures on numerous occasions.  Progress toward goals:  deteriorated, Isolated elevated blood pressure. Barriers to meeting goals:  no barriers identified  Plan: Hypertension treatment:  continue current medications, as this is an isolated incident, with prior well-controlled blood pressures, I will not change her medications at this time. In the future we can consider escalating lisinopril dosage if indicated. The patient is recommended to have close followup with her PCP for continued monitoring and adjustment of therapy as indicated.    

## 2010-12-18 NOTE — Progress Notes (Signed)
  Subjective:    Patient ID: [name] Barron, female    DOB: 1934/03/29, 77 y.o.   MRN: [id number]  HPI Pt is a 77 y.o. female who  has a past medical history of Anxiety; Depression; Hypertension; Diverticulosis of colon; Tubular adenoma of colon; Chronic sinusitis; Inadequate material resources; and Osteopenia. and presents to clinic today for the following:   1) Ear fullness - left ear has been feeling full occuring intermittently for several weeks, is without pain, discharge, fevers, chills. Is not affecting her hearing. Denies nasal congestion, occasional itchiness of nose and eyes.   2) HTN - Patient does not check blood pressure regularly at home. Currently regularly taking Lisinopril-HCTZ 20-25mg  daily without missing doses. Denies headaches, dizziness, lightheadedness, chest pain, shortness of breath.  3) Preventative care - the patient is due for her PCV.   Review of Systems Per HPI.  Current Outpatient Medications Medication Sig  . Calcium Carb-Cholecalciferol 600-400 MG-UNIT TABS Take by mouth 3 (three) times daily.    Marland Kitchen FLUoxetine (PROZAC) 40 MG capsule Take 1 capsule (40 mg total) by mouth daily.  Marland Kitchen lisinopril-hydrochlorothiazide (PRINZIDE,ZESTORETIC) 20-25 MG per tablet Take 1 tablet by mouth daily.  . Omega-3 Fatty Acids (FISH OIL) 1000 MG CAPS Take by mouth 3 (three) times daily.    . ARIPiprazole (ABILIFY) 5 MG tablet Take 1 tablet (5 mg total) by mouth daily.  . cetirizine (ZYRTEC) 10 MG chewable tablet Chew 1 tablet (10 mg total) by mouth daily.   Allergies Review of patient's allergies indicates no known allergies.  Past Medical History  Diagnosis Date  . Anxiety   . Depression   . Hypertension   . Diverticulosis of colon   . Tubular adenoma of colon     Unclear history. Unclear as to when the colonoscopy was done. Appeared she was referred to GI for a repeat colonoscopy in 2009. No records in EMR. Positive hemoccult in 1997  . Chronic sinusitis   . Inadequate  material resources   . Osteopenia     DEXA in 2009    Past Surgical History  Procedure Date  . Abdominal hysterectomy 1970s       Objective:   Physical Exam General: Vital signs reviewed and noted. Well-developed, well-nourished, in no acute distress; alert, appropriate and cooperative throughout examination.  Head: Normocephalic, atraumatic.  Ears: TM nonerythematous, not bulging, good light reflex bilaterally.No evident cerumen buildup bilaterally.   Nose: Mucous membranes moist, mildly inflammed with discharge, nonerythematous.  Throat: Oropharynx nonerythematous, no exudate appreciated.   Neck: No deformities, masses, or tenderness noted.  Lungs:  Normal respiratory effort. Clear to auscultation BL without crackles or wheezes.  Heart: RRR. S1 and S2 normal without gallop, murmur, or rubs.  Abdomen:  BS normoactive. Soft, Nondistended, non-tender.  No masses or organomegaly.  Extremities: No pretibial edema.       Assessment & Plan:  Case and plan of care discussed with Dr. Doneen Poisson.

## 2010-12-18 NOTE — Assessment & Plan Note (Signed)
-   PCV today.

## 2011-01-02 ENCOUNTER — Ambulatory Visit (INDEPENDENT_AMBULATORY_CARE_PROVIDER_SITE_OTHER): Payer: Medicare Other | Admitting: Internal Medicine

## 2011-01-02 ENCOUNTER — Encounter: Payer: Medicare Other | Admitting: Internal Medicine

## 2011-01-02 ENCOUNTER — Encounter: Payer: Self-pay | Admitting: Internal Medicine

## 2011-01-02 ENCOUNTER — Ambulatory Visit: Payer: Medicare Other | Admitting: Licensed Clinical Social Worker

## 2011-01-02 VITALS — BP 176/94 | HR 94 | Temp 96.8°F | Ht 62.0 in | Wt 130.0 lb

## 2011-01-02 DIAGNOSIS — F329 Major depressive disorder, single episode, unspecified: Secondary | ICD-10-CM

## 2011-01-02 DIAGNOSIS — I10 Essential (primary) hypertension: Secondary | ICD-10-CM

## 2011-01-02 DIAGNOSIS — E785 Hyperlipidemia, unspecified: Secondary | ICD-10-CM

## 2011-01-02 MED ORDER — SERTRALINE HCL 50 MG PO TABS: 50.0000 mg | ORAL_TABLET | Freq: Every day | ORAL | Status: DC

## 2011-01-02 NOTE — Progress Notes (Addendum)
45 minutes.  Referred by Jeanie Cooks and Mr. Aldine Contes.  Patient is well known to social work due to past hx of homelessness and depression.  Patient comes to see me today to sort out multiple issues including depression request for inhome aide, and transportation.  Patient has [place] and Medicaid.   Patient now has stable housing after being homeless and in shelters last year.  Her own home is not fit to live in yet she still owns it and pay taxes on the property.  Her godson is a huge support to her and helped her land the apartment.  The patient's  own son is a burden to her and alcoholic.  She has not talked to her own son in about two years and she is tearful when discussing this.   Depression:  She said her anti-depressant is not working.  She reports that she cannot get out of bed and can go for weeks without bathing, grooming, or dressing.  She has finally connected with area mental health [facility]) and has appmt with therapist on Wed 8/22 at 3 PM and I've strongly encouraged her to keep that appmt to address severe depression.  I've also urged her to reconnect with Al-Anon due to chaotic family background and hx of alcoholism in her family hx.   Patient working with Ulanda Edison at [employer] who referred her to [facility]  She did not meet criteria for that program.  Church:  She mentioned some issues with her [employer] and not wanting to attend church anymore.    Housing:  She has stable housing but tells me she does not get along with some of the tenants there.  She told me a similar story when she lived in her own home and did not get along with her neighbors.    Transit:  She has her own vehicle but would like to use Medicaid transport sometimes.  I have explained and written out how to use Medicaid transport so she has that option.   Counseling:  I have encouraged patient to utilize positive coping statements and positive self-talk.  She is extremely negative and has very low confidence.     Memory:  She appears to have short-term memory issues as I seem to be repeating the same information over and over again to her. I will ask Dr. Aldine Contes if someone can do a MMSE at the next visit.  This may be due to depression and loss of concentration but it is hard to distinguish.  I've encouraged her to keep a calendar of her appmts as she seems to have difficulties keeping up with appmts.  She said she has a calendar but doesn't use it.   A/P  Low resource elderly patient with severe depression, low social supports and family issues--reported issues with self-care.   --We will request an assessment for inhome aide to see whether she would qualify based on depression and lower extremity pain.  --We will continue to encourage MH care both psychiatry and counseling.  --Nursing will set up colonscopy and remind her to set up Medicaid transport.  --SW will work with patient ongoing.   The patient is receptive to following through on these referrals;  encouraged her to call me with any questions or problems.  Order will be placed for inhome aide.

## 2011-01-05 ENCOUNTER — Encounter: Payer: Self-pay | Admitting: Internal Medicine

## 2011-01-05 NOTE — Progress Notes (Signed)
  Subjective:    Patient ID: [name] Barron, female    DOB: January 11, 1934, 77 y.o.   MRN: [id number]  HPI  77 year old female with past medical history outlined below presents to clinic for followup on her depression. She reports taking one medication for several months which has not been helping her symptoms and she would like to discuss changing the medication. She tells me that she lives alone but has one son in his 61s who has chronic alcoholic and she worries about him constantly. She reports feeling sad and lonely but tells me he has several family members in [place] who is at her frequently. She is able to ambulate and carry out activities of daily living without difficulties. She denies changes in sleep patterns, appetite change, weight loss or weight gain. She reports having thoughts of hurting herself or over the past month but currently denies any suicidal or homicidal ideations. Also denies having plan of how to hurt herself. She denies changes in energy levels, she reports feeling more tired but also tells me she gets enough sleep.  Review of Systems Per history of present illness    Objective:   Physical Exam  Constitutional: Vital signs reviewed.  Patient is a well-developed and well-nourished in no acute distress and cooperative with exam. Alert and oriented x3.  Head: Normocephalic and atraumatic Ear: TM normal bilaterally Mouth: no erythema or exudates, MMM Eyes: PERRL, EOMI, conjunctivae normal, No scleral icterus.  Neck: Supple, Trachea midline normal ROM, No JVD, mass, thyromegaly, or carotid bruit present.  Cardiovascular: RRR, S1 normal, S2 normal, no MRG, pulses symmetric and intact bilaterally Pulmonary/Chest: CTAB, no wheezes, rales, or rhonchi Abdominal: Soft. Non-tender, non-distended, bowel sounds are normal, no masses, organomegaly, or guarding present.  GU: no CVA tenderness Musculoskeletal: No joint deformities, erythema, or stiffness, ROM full and no  nontender Hematology: no cervical, inginal, or axillary adenopathy.  Neurological: A&O x3, Strenght is normal and symmetric bilaterally, cranial nerve II-XII are grossly intact, no focal motor deficit, sensory intact to light touch bilaterally.  Skin: Warm, dry and intact. No rash, cyanosis, or clubbing.  Psychiatric: Occasionally crying throughout the interview. speech and behavior is normal. Judgment and thought content normal. Cognition and memory are normal.         Assessment & Plan:

## 2011-01-05 NOTE — Assessment & Plan Note (Signed)
Blood pressure is slightly above the goal. I have rechecked it during the visit and the blood pressure was 142/82. I have advised patient to check her blood pressure at home and to call us back if her numbers are persistently higher than 140/90. Electrolyte panel was checked recently and the results are within normal limits.

## 2011-01-05 NOTE — Assessment & Plan Note (Signed)
LDL and cholesterol are both at goal. I recommended continued compliance with recommended exercise and diet.

## 2011-01-05 NOTE — Assessment & Plan Note (Addendum)
Patient appears oriented in current living situation and her son and perhaps her symptoms are not adequately controlled on current medication. Review of records show that she has been on antidepressant for over 6 months and maximum dose and my plan is to switch her to Zoloft 50 mg tablet monthly daily. I have explained to her that it may take 4-6 weeks before the effect is evident and I suggested that she exercises regularly 20 minutes a day along with proper hydration. In addition I have advised patient to eat properly. Patient has agreed to call was or go to the ED he should develop suicidal or homicidal ideations. Lab work was checked a few weeks ago and electrolyte panel, TSH was within normal limits. I have also placed referral to social worker Lupita Leash to see if pt may be eligible for assistance such as counseling.

## 2011-01-21 ENCOUNTER — Encounter: Payer: Self-pay | Admitting: Internal Medicine

## 2011-01-21 ENCOUNTER — Ambulatory Visit (INDEPENDENT_AMBULATORY_CARE_PROVIDER_SITE_OTHER): Payer: Medicare Other | Admitting: Internal Medicine

## 2011-01-21 DIAGNOSIS — F329 Major depressive disorder, single episode, unspecified: Secondary | ICD-10-CM

## 2011-01-21 DIAGNOSIS — F411 Generalized anxiety disorder: Secondary | ICD-10-CM

## 2011-01-21 DIAGNOSIS — I10 Essential (primary) hypertension: Secondary | ICD-10-CM

## 2011-01-21 NOTE — Patient Instructions (Addendum)
1. Continue with the medication.  I would call the [facility] and talk to them about your concerns about Wellbutrin.  2. You can try the breathing exercises we talked about today.  In through your nose for 5 seconds and out through your mouth for 5 seconds.    3. Follow up with [place] in 2-3 months ago.   Anxiety and Panic Attacks Your caregiver has informed you that you are having an anxiety or panic attack. There may be many forms of this. Most of the time these attacks come suddenly and without warning. They come at any time of day, including periods of sleep, and at any time of life. They may be strong and unexplained. Although panic attacks are very scary, they are physically harmless. Sometimes the cause of your anxiety is not known. Anxiety is a protective mechanism of the body in its fight or flight mechanism. Most of these perceived danger situations are actually nonphysical situations (such as anxiety over losing a job). CAUSES The causes of an anxiety or panic attack are many. Panic attacks may occur in otherwise healthy people given a certain set of circumstances. There may be a genetic cause for panic attacks. Some medications may also have anxiety as a side effect. SYMPTOMS Some of the most common feelings are:  Intense terror.  Dizziness, feeling faint.   Hot and cold flashes.   Fear of going crazy.   Feelings that nothing is real.   Sweating.   Shaking.   Chest pain or a fast heartbeat (palpitations).  Smothering, choking sensations.   Feelings of impending doom and that death is near.   Tingling of extremities, this may be from over breathing.   Altered reality (derealization).   Being detached from yourself (depersonalization).   Several symptoms can be present to make up anxiety or panic attacks. DIAGNOSIS The evaluation by your caregiver will depend on the type of symptoms you are experiencing. The diagnosis of anxiety or pain attack is made when no  physical illness can be determined to be a cause of the symptoms. TREATMENT Treatment to prevent anxiety and panic attacks may include:  Avoidance of circumstances that cause anxiety.   Reassurance and relaxation.   Regular exercise.   Relaxation therapies, such as yoga.   Psychotherapy with a psychiatrist or therapist.   Avoidance of caffeine, alcohol and illegal drugs.   Prescribed medication.  SEEK IMMEDIATE MEDICAL CARE IF:  You experience panic attack symptoms that are different than your usual symptoms.   You have any worsening or concerning symptoms.  Document Released: 05/04/2005 Document Re-Released: 10/22/2009 [facility] Patient Information 2011 [place], [place].
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
## 2011-01-22 NOTE — Assessment & Plan Note (Signed)
Her depression appears to be better controlled.  She is going to the [facility] now so we will defer medication management for depression and anxiety to them.

## 2011-01-22 NOTE — Assessment & Plan Note (Signed)
BP Readings from Last 3 Encounters:  01/21/11 138/84  01/02/11 176/94  12/18/10 149/88   Blood pressure today is well controlled.  WE will continue to monitor at this time.

## 2011-01-22 NOTE — Assessment & Plan Note (Signed)
I think her occasional SOB is likely related to panic attacks.  She was started on Wellbutrin and I think she is having anxiety was heightened from that.  We discussed relaxation techniques and I [facility] [employer]  She will call the [facility] to discuss the medication and if they would like to start anything for her anxiety.

## 2011-01-29 ENCOUNTER — Inpatient Hospital Stay (INDEPENDENT_AMBULATORY_CARE_PROVIDER_SITE_OTHER)
Admission: RE | Admit: 2011-01-29 | Discharge: 2011-01-29 | Disposition: A | Discharge status: Home / Self Care | Payer: Medicare Other | Source: Ambulatory Visit | Attending: Emergency Medicine | Admitting: Emergency Medicine

## 2011-01-29 DIAGNOSIS — H05019 Cellulitis of unspecified orbit: Secondary | ICD-10-CM

## 2011-01-30 ENCOUNTER — Inpatient Hospital Stay (HOSPITAL_COMMUNITY)
Admission: RE | Admit: 2011-01-30 | Discharge: 2011-01-30 | Disposition: A | Discharge status: Home / Self Care | Payer: Medicare Other | Source: Ambulatory Visit | Attending: Family Medicine | Admitting: Family Medicine

## 2011-02-18 ENCOUNTER — Encounter: Payer: Medicare Other | Admitting: Internal Medicine

## 2011-02-19 ENCOUNTER — Other Ambulatory Visit: Payer: Self-pay | Admitting: Gastroenterology

## 2011-02-19 ENCOUNTER — Ambulatory Visit (HOSPITAL_COMMUNITY)
Admission: RE | Admit: 2011-02-19 | Discharge: 2011-02-19 | Disposition: A | Discharge status: Home / Self Care | Payer: Medicare Other | Source: Ambulatory Visit | Attending: Gastroenterology | Admitting: Gastroenterology

## 2011-02-19 DIAGNOSIS — Z1211 Encounter for screening for malignant neoplasm of colon: Secondary | ICD-10-CM | POA: Insufficient documentation

## 2011-02-19 DIAGNOSIS — M899 Disorder of bone, unspecified: Secondary | ICD-10-CM | POA: Insufficient documentation

## 2011-02-19 DIAGNOSIS — E785 Hyperlipidemia, unspecified: Secondary | ICD-10-CM | POA: Insufficient documentation

## 2011-02-19 DIAGNOSIS — I10 Essential (primary) hypertension: Secondary | ICD-10-CM | POA: Insufficient documentation

## 2011-02-19 DIAGNOSIS — Z79899 Other long term (current) drug therapy: Secondary | ICD-10-CM | POA: Insufficient documentation

## 2011-02-19 DIAGNOSIS — K5730 Diverticulosis of large intestine without perforation or abscess without bleeding: Secondary | ICD-10-CM | POA: Insufficient documentation

## 2011-02-19 DIAGNOSIS — D126 Benign neoplasm of colon, unspecified: Secondary | ICD-10-CM | POA: Insufficient documentation

## 2011-02-19 DIAGNOSIS — Z90710 Acquired absence of both cervix and uterus: Secondary | ICD-10-CM | POA: Insufficient documentation

## 2011-02-19 DIAGNOSIS — M949 Disorder of cartilage, unspecified: Secondary | ICD-10-CM | POA: Insufficient documentation

## 2011-02-24 NOTE — Op Note (Signed)
  [name], [name] NO.:  [id number]  MEDICAL RECORD NO.:  [id number]  LOCATION:  [facility]                         FACILITY:  [facility]  PHYSICIAN:  Danise Edge, M.D.   DATE OF BIRTH:  07-Sep-1933  DATE OF PROCEDURE:  02/19/2011 DATE OF DISCHARGE:                              OPERATIVE REPORT   REFERRING PHYSICIAN:  Blanca Friend, MD, [facility].  HISTORY:  Ms. [name] Barron is a 77 year old female born on [birth year].  The patient is scheduled to undergo a screening colonoscopy with polypectomy to prevent colon cancer.  MEDICATION ALLERGIES:  Barron.  PAST MEDICAL AND SURGICAL HISTORY: 1. Hysterectomy. 2. Uterine fibroid tumors. 3. Dyslipidemia. 4. Anxiety. 5. Depression. 6. Hypertension. 7. Allergic rhinitis. 8. Colonic diverticulosis. 9. Osteopenia.  CHRONIC MEDICATIONS:  Lisinopril, hydrochlorothiazide, cetirizine, Wellbutrin, calcium, omega-3 fatty acid.  ENDOSCOPIST:  Danise Edge, M.D.  PREMEDICATION:  Fentanyl 50 mcg, Versed 5 mg.  PROCEDURE:  The patient was placed in the left lateral decubitus position.  Anal inspection and digital rectal exam were normal.  The Pentax pediatric colonoscope was introduced into the rectum and easily advanced to the cecum.  A normal-appearing ileocecal valve and appendiceal orifice were identified.  Colonic preparation for the exam today was good.  The patient has universal colonic diverticulosis without signs of diverticulitis or diverticular stricture formation. Rectum normal.  Retroflex view of the distal rectum normal. Sigmoid colon.  From the mid sigmoid colon, a 5-mm pedunculated polyp was removed with the cold snare. Descending colon normal. Splenic flexure normal. Transverse colon normal. Hepatic flexure normal. Ascending colon normal. Cecum and ileocecal valve normal.  ASSESSMENT: 1. Universal colonic diverticulosis. 2. From the mid sigmoid colon, a 5-mm  pedunculated polyp was removed     with cold snare. 3. Otherwise normal screening proctocolonoscopy to the cecum.          ______________________________ Danise Edge, M.D.     MJ/MEDQ  D:  02/19/2011  T:  02/19/2011  Job:  [id number]  cc:   Blanca Friend, MD  Electronically Signed by Danise Edge M.D. on 02/24/2011 04:09:13 PM

## 2011-03-23 ENCOUNTER — Encounter: Payer: Self-pay | Admitting: Internal Medicine

## 2011-03-25 ENCOUNTER — Ambulatory Visit (INDEPENDENT_AMBULATORY_CARE_PROVIDER_SITE_OTHER): Payer: Medicare Other | Admitting: Internal Medicine

## 2011-03-25 ENCOUNTER — Encounter: Payer: Self-pay | Admitting: Internal Medicine

## 2011-03-25 VITALS — BP 145/82 | HR 114 | Temp 98.3°F | Ht 62.0 in | Wt 131.4 lb

## 2011-03-25 DIAGNOSIS — F329 Major depressive disorder, single episode, unspecified: Secondary | ICD-10-CM

## 2011-03-25 DIAGNOSIS — M899 Disorder of bone, unspecified: Secondary | ICD-10-CM

## 2011-03-25 DIAGNOSIS — I10 Essential (primary) hypertension: Secondary | ICD-10-CM

## 2011-03-25 DIAGNOSIS — J309 Allergic rhinitis, unspecified: Secondary | ICD-10-CM

## 2011-03-25 DIAGNOSIS — E785 Hyperlipidemia, unspecified: Secondary | ICD-10-CM

## 2011-03-25 LAB — LIPID PANEL
LDL Cholesterol: 79 mg/dL (ref 0–99)
Total CHOL/HDL Ratio: 5.3 Ratio

## 2011-03-25 MED ORDER — FLUTICASONE PROPIONATE 50 MCG/ACT NA SUSP: 1.0000 | Freq: Every day | NASAL | Status: DC

## 2011-03-25 MED ORDER — CETIRIZINE HCL 10 MG PO CHEW: 10.0000 mg | CHEWABLE_TABLET | Freq: Every day | ORAL | Status: DC

## 2011-03-25 MED ORDER — AZITHROMYCIN 250 MG PO TABS: ORAL_TABLET | ORAL | Status: AC

## 2011-03-25 NOTE — Assessment & Plan Note (Signed)
Symptoms more consistent with chronic allergic rhinitis vs sinus infection. Given duration of symptoms with productive cough, will try pt on z-pak along with nasal spray. Will refill zyrtec as patient did not know she was supposed to be on it.

## 2011-03-25 NOTE — Progress Notes (Signed)
  Subjective:    Patient ID: [name] Barron, female    DOB: [birth year], 77 y.o.   MRN: [id number]  HPI  [name] Barron is a 77 year old woman with pmh significant for Depression, anxiety, and HTN who presents for the following"  1.) Sinus infection - runny nose, no fevers or chills, cough - productive sputum, sneezing, for over 1 month. Took OTC claritin without relief.   2.) Depression - on Wellbutrin for a few months. Doesn't know if it is helping as she still has low energy and mood is low. No difficulty with sleep or appetite. Difficulty with concentration. Denies suicidal ideations. Has appt with mental health 04/22/2011.  Patient has no other complaints or concerns today. She denies chest pain, cough, sob, headache, N/V, changes in abdominal and urinary character.   Review of Systems  All other systems reviewed and are negative.       Objective:   Physical Exam  Constitutional: She is oriented to person, place, and time. She appears well-developed.  HENT:  Head: Normocephalic and atraumatic.  Right Ear: External ear normal.  Mouth/Throat: Oropharynx is clear and moist.       Left ear cerumen impaction Nasal turbinated inflamed  Neck: Normal range of motion. Neck supple.  Cardiovascular: Normal rate and regular rhythm.   Pulmonary/Chest: Effort normal and breath sounds normal. She has no wheezes.  Abdominal: Soft. Bowel sounds are normal.  Musculoskeletal: Normal range of motion.  Neurological: She is alert and oriented to person, place, and time.  Psychiatric: She has a normal mood and affect.          Assessment & Plan:

## 2011-03-25 NOTE — Assessment & Plan Note (Addendum)
Lab Results  Component Value Date   NA 140 12/18/2010   K 4.2 12/18/2010   CL 105 12/18/2010   CO2 23 12/18/2010   BUN 18 12/18/2010   CREATININE 0.78 12/18/2010   CREATININE 0.80 08/09/2009    BP Readings from Last 3 Encounters:  03/25/11 145/82  01/21/11 138/84  01/02/11 176/94    Assessment: Hypertension control:  mildly elevated  Progress toward goals:  unchanged Barriers to meeting goals:  no barriers identified Patient forgot to take anti-hypertensive today.  Plan: Hypertension treatment:  continue current medications

## 2011-03-25 NOTE — Assessment & Plan Note (Addendum)
Patient not aware or maybe forgotten that she is on Cal-Vit D, and was once again reminded to take for bone health. Last bone density scan done in 2009 but cannot access results due to technical difficulties.

## 2011-03-25 NOTE — Assessment & Plan Note (Addendum)
Pt c/o significant depression with decreased energy and concentration. Also accompanied by periods where she feels short of breath which in past attributed to panic attacks. Since patient has appt with Mental Health in December, will not alter her therapy, but I am concerned about panic attacks, as well as mild cognitive impairment as she asked me the same questions throughout the visit. She asked for instructions repeatedly and then mentioned how she forgot to take her medications this morning. She may need further evaluation with [facility] for this issue. Was not able to do today given time constraints.

## 2011-03-25 NOTE — Patient Instructions (Signed)
Please follow up with Mental Health in December and please discuss your current symptoms. Do not forget to discuss those times where you feel short of breath as it may be related to panic attacks. Please take Zyrtec, Z-pak which is your antibiotic and nasal spray as directed. Please do not forget to take your other medications. Please follow up in 1-2 months.

## 2011-04-22 ENCOUNTER — Ambulatory Visit (INDEPENDENT_AMBULATORY_CARE_PROVIDER_SITE_OTHER): Payer: Medicare Other | Admitting: Internal Medicine

## 2011-04-22 ENCOUNTER — Encounter: Payer: Self-pay | Admitting: Internal Medicine

## 2011-04-22 DIAGNOSIS — J309 Allergic rhinitis, unspecified: Secondary | ICD-10-CM

## 2011-04-22 DIAGNOSIS — B370 Candidal stomatitis: Secondary | ICD-10-CM

## 2011-04-22 MED ORDER — FLUTICASONE PROPIONATE 50 MCG/ACT NA SUSP: 2.0000 | Freq: Two times a day (BID) | NASAL | Status: DC

## 2011-04-22 MED ORDER — FLUCONAZOLE 100 MG PO TABS: ORAL_TABLET | ORAL | Status: DC
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
## 2011-04-22 NOTE — Patient Instructions (Signed)
1. Pick up the Zyrtec (Cetirizine) at the pharmacy.  Ask the pharmacist to help you find the generic brand which will be cheaper.   Take 1 tablet daily  2.  Increase the Flonase to 2 sprays in each nostril twice daily.  3.  Start the Fluconazole 100 mg tablets take 2 tablets the first day and then 1 tablet daily until gone.  4.  Follow up in 2 weeks to see how you are doing.  5.  Continue all of your other medications.

## 2011-04-26 NOTE — Assessment & Plan Note (Signed)
Her coughing and gagging is likely related to her allergic rhinitis. She has no other warning signs of URI.  The plan is to increase her Flonase to 2 sprays twice daily and also ensure she is taking her Zyrtec.  She was counseled to return to clinic if she spikes a fever or becomes short of breath.

## 2011-04-26 NOTE — Assessment & Plan Note (Signed)
She has oral thrush.  We will treat this with a course of Fluconazole.

## 2011-05-06 ENCOUNTER — Encounter: Payer: Medicare Other | Admitting: Internal Medicine

## 2011-05-10 ENCOUNTER — Emergency Department (INDEPENDENT_AMBULATORY_CARE_PROVIDER_SITE_OTHER)
Admission: EM | Admit: 2011-05-10 | Discharge: 2011-05-10 | Disposition: A | Discharge status: Home / Self Care | Payer: Medicare Other | Source: Home / Self Care | Attending: Family Medicine | Admitting: Family Medicine

## 2011-05-10 ENCOUNTER — Encounter (HOSPITAL_COMMUNITY): Payer: Self-pay

## 2011-05-10 DIAGNOSIS — R05 Cough: Secondary | ICD-10-CM

## 2011-05-10 DIAGNOSIS — J310 Chronic rhinitis: Secondary | ICD-10-CM

## 2011-05-10 MED ORDER — GUAIFENESIN-DM 100-10 MG/5ML PO SYRP: 5.0000 mL | ORAL_SOLUTION | Freq: Three times a day (TID) | ORAL | Status: AC | PRN

## 2011-05-10 NOTE — ED Provider Notes (Signed)
History     CSN: [id number]  Arrival date & time 05/10/11  1532   First MD Initiated Contact with Patient 05/10/11 1611      Chief Complaint  Patient presents with  . Wheezing    (Consider location/radiation/quality/duration/timing/severity/associated sxs/prior treatment) HPI Comments: [name] Barron presents for evaluation of 3 weeks of persistent non-productive cough, shortness of breath, and fatigue. She denies any fever but is unsure. She does not smoke and denies any exposure. She has taken over the counter products without relief.   Patient is a 77 y.o. female presenting with wheezing. The history is provided by the patient.  Wheezing  The current episode started more than 2 weeks ago. The onset was sudden. The problem occurs frequently. The problem has been unchanged. The problem is mild. The symptoms are relieved by nothing. The symptoms are aggravated by nothing. Associated symptoms include wheezing. Pertinent negatives include no fever. The cough has no precipitants. The cough is non-productive. Nothing relieves the cough.    Past Medical History  Diagnosis Date  . Anxiety   . Depression   . Hypertension   . Diverticulosis of colon   . Tubular adenoma of colon     Unclear history. Unclear as to when the colonoscopy was done. Appeared she was referred to GI for a repeat colonoscopy in 2009. No records in EMR. Positive hemoccult in 1997  . Chronic sinusitis   . Inadequate material resources   . Osteopenia     DEXA in 2009    Past Surgical History  Procedure Date  . Abdominal hysterectomy 1970s    History reviewed. No pertinent family history.  History  Substance Use Topics  . Smoking status: Never Smoker   . Smokeless tobacco: Not on file  . Alcohol Use: No    OB History    Grav Para Term Preterm Abortions TAB SAB Ect Mult Living                  Review of Systems  Constitutional: Positive for fatigue. Negative for fever.  HENT: Negative.   Eyes: Negative.    Respiratory: Positive for choking and wheezing.   Gastrointestinal: Negative.   Genitourinary: Negative.   Musculoskeletal: Negative.   Neurological: Negative.     Allergies  Review of patient's allergies indicates no known allergies.  Home Medications   Current Outpatient Rx  Name Route Sig Dispense Refill  . BUPROPION HCL ER (XL) 150 MG PO TB24 Oral Take 300 mg by mouth daily.      Marland Kitchen CALCIUM CARB-CHOLECALCIFEROL 600-400 MG-UNIT PO TABS Oral Take by mouth 3 (three) times daily.      Marland Kitchen CETIRIZINE HCL 10 MG PO CHEW Oral Chew 1 tablet (10 mg total) by mouth daily. 30 tablet 2  . FLUCONAZOLE 100 MG PO TABS  Take 2 tablets the first day and then 1 tablet daily until gone. 15 tablet 0  . FLUTICASONE PROPIONATE 50 MCG/ACT NA SUSP Nasal Place 2 sprays into the nose 2 (two) times daily. 16 g 2  . LISINOPRIL-HYDROCHLOROTHIAZIDE 20-25 MG PO TABS Oral Take 1 tablet by mouth daily. 30 tablet 6  . FISH OIL 1000 MG PO CAPS Oral Take by mouth 3 (three) times daily.        BP 169/105  Pulse 108  Temp(Src) 99 F (37.2 C) (Oral)  Resp 18  SpO2 96%  LMP 05/04/2011  Physical Exam  Nursing note and vitals reviewed. Constitutional: She is oriented to person, place, and time. She appears  well-developed and well-nourished.  HENT:  Head: Normocephalic and atraumatic.  Eyes: EOM are normal.  Neck: Normal range of motion.  Cardiovascular: Regular rhythm and normal heart sounds.  Tachycardia present.   Pulmonary/Chest: Effort normal and breath sounds normal.  Musculoskeletal: Normal range of motion.  Neurological: She is alert and oriented to person, place, and time.  Skin: Skin is warm and dry.  Psychiatric: Her behavior is normal.    ED Course  Procedures (including critical care time)  Labs Reviewed - No data to display No results found.   1. Cough   2. Rhinitis       MDM          Richardo Priest, MD 05/22/11 0003

## 2011-05-10 NOTE — ED Notes (Signed)
Pt has wheezing, cough and weakness and "just doesn't feel right" for one week

## 2011-05-25 ENCOUNTER — Encounter: Payer: Self-pay | Admitting: Internal Medicine

## 2011-05-25 ENCOUNTER — Ambulatory Visit (INDEPENDENT_AMBULATORY_CARE_PROVIDER_SITE_OTHER): Payer: Medicare Other | Admitting: Internal Medicine

## 2011-05-25 VITALS — BP 131/79 | HR 96 | Ht 62.0 in | Wt 125.9 lb

## 2011-05-25 DIAGNOSIS — F411 Generalized anxiety disorder: Secondary | ICD-10-CM

## 2011-05-25 DIAGNOSIS — R5383 Other fatigue: Secondary | ICD-10-CM

## 2011-05-25 DIAGNOSIS — R5381 Other malaise: Secondary | ICD-10-CM

## 2011-05-25 DIAGNOSIS — B370 Candidal stomatitis: Secondary | ICD-10-CM

## 2011-05-25 DIAGNOSIS — R531 Weakness: Secondary | ICD-10-CM

## 2011-05-25 DIAGNOSIS — F329 Major depressive disorder, single episode, unspecified: Secondary | ICD-10-CM

## 2011-05-25 DIAGNOSIS — J309 Allergic rhinitis, unspecified: Secondary | ICD-10-CM

## 2011-05-25 DIAGNOSIS — I10 Essential (primary) hypertension: Secondary | ICD-10-CM

## 2011-05-25 LAB — TSH: TSH: 0.685 u[IU]/mL (ref 0.350–4.500)

## 2011-05-25 MED ORDER — CETIRIZINE HCL 10 MG PO TABS: 10.0000 mg | ORAL_TABLET | Freq: Every day | ORAL | Status: DC

## 2011-05-25 NOTE — Assessment & Plan Note (Signed)
Depressed mood seems better per patient but still appears significant on exam.  Patient describes significant anhedonia.  Appetite loss may be related to depression as well.  Likely needs an additional SSRI to Wellbutrin, but will defer management to [facility] which sees her for mental health.  Next appt 06/08/11 @ 9am and patient has card with appt.  Will check TSH and BMET today.

## 2011-05-25 NOTE — Assessment & Plan Note (Signed)
BP ok today.  No change for now.

## 2011-05-25 NOTE — Progress Notes (Signed)
I agree with Dr. Wainwright's assessment and plan. 

## 2011-05-25 NOTE — Assessment & Plan Note (Signed)
Resolved on exam today.  Will recheck next visit.

## 2011-05-25 NOTE — Assessment & Plan Note (Signed)
Managed by [employer].  Next appt with them is 06/08/11 @ 9am.  Patient has card with this appt.

## 2011-05-25 NOTE — Assessment & Plan Note (Signed)
Seems to be doing a bit better.  Will continue flonase.  Will continue Zyrtec.  Switched Zyrtec to regular since chewable tablet that was ordered may have been why it is so expensive for her.
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
## 2011-05-25 NOTE — Patient Instructions (Signed)
Please return to clinic in 2 months. Please keep your appt at [facility] on 06/08/2011

## 2011-05-25 NOTE — Assessment & Plan Note (Signed)
Unsure etiology.  Weakness, shaking son had to walk to her to her car.  Will check BMET in setting of diuretic therapy as well as TSH.  Possible due to decreased PO intake (which may be due to depression?).

## 2011-05-26 LAB — BASIC METABOLIC PANEL WITH GFR
CO2: 27 mEq/L (ref 19–32)
Calcium: 9.1 mg/dL (ref 8.4–10.5)
GFR, Est Non African American: 61 mL/min
Sodium: 140 mEq/L (ref 135–145)

## 2011-05-28 ENCOUNTER — Encounter: Payer: Self-pay | Admitting: Licensed Clinical Social Worker
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
## 2011-05-30 ENCOUNTER — Emergency Department (HOSPITAL_COMMUNITY)
Admission: EM | Admit: 2011-05-30 | Discharge: 2011-05-30 | Disposition: A | Discharge status: Home / Self Care | Payer: Medicare Other | Attending: Emergency Medicine | Admitting: Emergency Medicine

## 2011-05-30 ENCOUNTER — Encounter (HOSPITAL_COMMUNITY): Payer: Self-pay | Admitting: *Deleted

## 2011-05-30 ENCOUNTER — Emergency Department (HOSPITAL_COMMUNITY): Payer: Medicare Other

## 2011-05-30 DIAGNOSIS — R0602 Shortness of breath: Secondary | ICD-10-CM | POA: Insufficient documentation

## 2011-05-30 DIAGNOSIS — R000 Tachycardia, unspecified: Secondary | ICD-10-CM | POA: Insufficient documentation

## 2011-05-30 DIAGNOSIS — R5381 Other malaise: Secondary | ICD-10-CM | POA: Insufficient documentation

## 2011-05-30 DIAGNOSIS — R110 Nausea: Secondary | ICD-10-CM | POA: Insufficient documentation

## 2011-05-30 DIAGNOSIS — R630 Anorexia: Secondary | ICD-10-CM | POA: Insufficient documentation

## 2011-05-30 DIAGNOSIS — Z79899 Other long term (current) drug therapy: Secondary | ICD-10-CM | POA: Insufficient documentation

## 2011-05-30 DIAGNOSIS — R42 Dizziness and giddiness: Secondary | ICD-10-CM | POA: Insufficient documentation

## 2011-05-30 DIAGNOSIS — F341 Dysthymic disorder: Secondary | ICD-10-CM | POA: Insufficient documentation

## 2011-05-30 DIAGNOSIS — C3490 Malignant neoplasm of unspecified part of unspecified bronchus or lung: Secondary | ICD-10-CM

## 2011-05-30 DIAGNOSIS — I10 Essential (primary) hypertension: Secondary | ICD-10-CM | POA: Insufficient documentation

## 2011-05-30 LAB — DIFFERENTIAL
Basophils Absolute: 0 10*3/uL (ref 0.0–0.1)
Basophils Relative: 0 % (ref 0–1)
Eosinophils Absolute: 0.1 10*3/uL (ref 0.0–0.7)
Eosinophils Relative: 1 % (ref 0–5)
Lymphocytes Relative: 21 % (ref 12–46)
Lymphs Abs: 1.7 10*3/uL (ref 0.7–4.0)
Monocytes Absolute: 1 10*3/uL (ref 0.1–1.0)
Monocytes Relative: 12 % (ref 3–12)
Neutro Abs: 5.3 10*3/uL (ref 1.7–7.7)
Neutrophils Relative %: 66 % (ref 43–77)

## 2011-05-30 LAB — CBC
HCT: 48.2 % — ABNORMAL HIGH (ref 36.0–46.0)
Hemoglobin: 17.4 g/dL — ABNORMAL HIGH (ref 12.0–15.0)
MCH: 31.6 pg (ref 26.0–34.0)
MCHC: 36.1 g/dL — ABNORMAL HIGH (ref 30.0–36.0)
MCV: 87.5 fL (ref 78.0–100.0)
Platelets: 280 10*3/uL (ref 150–400)
RBC: 5.51 MIL/uL — ABNORMAL HIGH (ref 3.87–5.11)
RDW: 13.1 % (ref 11.5–15.5)
WBC: 8.1 10*3/uL (ref 4.0–10.5)

## 2011-05-30 LAB — URINALYSIS, ROUTINE W REFLEX MICROSCOPIC
Glucose, UA: NEGATIVE mg/dL
Hgb urine dipstick: NEGATIVE
Ketones, ur: 40 mg/dL — AB
Nitrite: NEGATIVE
Protein, ur: NEGATIVE mg/dL
Specific Gravity, Urine: 1.022 (ref 1.005–1.030)
Urobilinogen, UA: 1 mg/dL (ref 0.0–1.0)
pH: 6.5 (ref 5.0–8.0)

## 2011-05-30 LAB — CARDIAC PANEL(CRET KIN+CKTOT+MB+TROPI)
CK, MB: 2.9 ng/mL (ref 0.3–4.0)
Relative Index: INVALID (ref 0.0–2.5)
Total CK: 42 U/L (ref 7–177)
Troponin I: <0.3 ng/mL (ref <0.30)

## 2011-05-30 LAB — URINE MICROSCOPIC-ADD ON

## 2011-05-30 LAB — BASIC METABOLIC PANEL
BUN: 24 mg/dL — ABNORMAL HIGH (ref 6–23)
CO2: 24 mEq/L (ref 19–32)
Calcium: 10.2 mg/dL (ref 8.4–10.5)
Chloride: 100 mEq/L (ref 96–112)
Creatinine, Ser: 1 mg/dL (ref 0.50–1.10)
GFR calc Af Amer: 62 mL/min — ABNORMAL LOW (ref >90)
GFR calc non Af Amer: 54 mL/min — ABNORMAL LOW (ref >90)
Glucose, Bld: 98 mg/dL (ref 70–99)
Potassium: 4.5 mEq/L (ref 3.5–5.1)
Sodium: 138 mEq/L (ref 135–145)

## 2011-05-30 MED ORDER — IOHEXOL 350 MG/ML SOLN
80.0000 mL | Freq: Once | INTRAVENOUS | Status: AC | PRN
  Administered 2011-05-30: 80 mL via INTRAVENOUS

## 2011-05-30 MED ORDER — LORAZEPAM 1 MG PO TABS
1.0000 mg | ORAL_TABLET | Freq: Once | ORAL | Status: AC
  Administered 2011-05-30: 1 mg via ORAL
  Filled 2011-05-30: qty 1

## 2011-05-30 MED ORDER — SODIUM CHLORIDE 0.9 % IV BOLUS (SEPSIS)
1000.0000 mL | Freq: Once | INTRAVENOUS | Status: AC
  Administered 2011-05-30: 1000 mL via INTRAVENOUS

## 2011-05-30 NOTE — ED Notes (Signed)
Reports having sob, dizziness, shaking, no appetite x 3 weeks. No resp distress noted at triage, spo2 98%.

## 2011-05-30 NOTE — ED Provider Notes (Signed)
History     CSN: [id number]  Arrival date & time 05/30/11  1210   First MD Initiated Contact with Patient 05/30/11 1410      Chief Complaint  Patient presents with  . Dizziness  . Shortness of Breath    (Consider location/radiation/quality/duration/timing/severity/associated sxs/prior treatment) Patient is a 78 y.o. female presenting with shortness of breath. The history is provided by the patient.  Shortness of Breath  Associated symptoms include shortness of breath.   Patient presents with a 3 week history of shortness of breath and not feeling well. She states that it has gradually worsened over time. She also reports nausea, weakness, and some occasional dizziness. She denies chest pain, leg swelling, and palpitations. She denies fever, rhinorrhea, sore throat, wheezing, vomiting, diarrhea and sick contacts. She is a non-smoker. . She admits to having a lot of anxiety and depression issues stating that she worries all the time about her family and others. She has not eaten much in the past 3 weeks due to poor appetite. She was seen by a Child psychotherapist on 05/28/11. She has a history of homelessness but currently lives alone in an apartment.    Past Medical History  Diagnosis Date  . Anxiety   . Depression   . Hypertension   . Diverticulosis of colon   . Tubular adenoma of colon     Unclear history. Unclear as to when the colonoscopy was done. Appeared she was referred to GI for a repeat colonoscopy in 2009. No records in EMR. Positive hemoccult in 1997  . Chronic sinusitis   . Inadequate material resources   . Osteopenia     DEXA in 2009    Past Surgical History  Procedure Date  . Abdominal hysterectomy 1970s    History reviewed. No pertinent family history.  History  Substance Use Topics  . Smoking status: Never Smoker   . Smokeless tobacco: Not on file  . Alcohol Use: No    OB History    Grav Para Term Preterm Abortions TAB SAB Ect Mult Living                   Review of Systems  Respiratory: Positive for shortness of breath.    All pertinent positives and negatives reviewed in the history of present illness   Allergies  Review of patient's allergies indicates no known allergies.  Home Medications   Current Outpatient Rx  Name Route Sig Dispense Refill  . CALCIUM CARB-CHOLECALCIFEROL 600-400 MG-UNIT PO TABS Oral Take 1 tablet by mouth 2 (two) times daily.     Marland Kitchen CETIRIZINE HCL 10 MG PO TABS Oral Take 1 tablet (10 mg total) by mouth daily. 30 tablet 3  . FLUTICASONE PROPIONATE 50 MCG/ACT NA SUSP Nasal Place 2 sprays into the nose 2 (two) times daily. 16 g 2  . FISH OIL 1000 MG PO CAPS Oral Take by mouth 3 (three) times daily.        BP 142/86  Pulse 104  Temp(Src) 98.9 F (37.2 C) (Oral)  Resp 18  SpO2 96%  LMP 05/04/2011  Physical Exam  Constitutional: She is oriented to person, place, and time. She appears distressed.  Cardiovascular: Regular rhythm, normal heart sounds and normal pulses.  Tachycardia present.   Pulmonary/Chest: Effort normal and breath sounds normal. She has no wheezes. She has no rhonchi. She has no rales.  Abdominal: Soft. Bowel sounds are normal. There is no tenderness.  Neurological: She is alert and oriented to person,  place, and time.  Skin: Skin is warm and dry. She is not diaphoretic.  Psychiatric: Her speech is normal and behavior is normal. Judgment and thought content normal. Her mood appears anxious. Cognition and memory are normal. She exhibits a depressed mood.    ED Course  Procedures (including critical care time)   Labs Reviewed  BASIC METABOLIC PANEL  CBC  DIFFERENTIAL  URINALYSIS, ROUTINE W REFLEX MICROSCOPIC  CARDIAC PANEL(CRET KIN+CKTOT+MB+TROPI)     Patient has been seen in December for similar complaints. Will do a CTA of her chest to RO PE.    MDM     Date: 05/30/2011  Rate:113  Rhythm: sinus tachycardia  QRS Axis: normal  Intervals: normal  ST/T Wave abnormalities:  normal  Conduction Disutrbances:right bundle branch block  Narrative Interpretation:   Old EKG Reviewed: unchanged        Carlyle Dolly, PA-C 05/30/11 [phone]

## 2011-05-30 NOTE — ED Provider Notes (Signed)
Pt presented to ED w/ ED w/ c/o SOB, lightheadedness, fatigue, generalized weakness and decreased appetite x ~3wks.  Pt received from Lawyer, PA-C and Dr. Hyman Hopes, and sx thought to be secondary to anxiety and depression.  Because patient was tachycardic on exam, CTA chest ordered to r/o PE.  Neg for PE but pos for what appears to be a right lower lobe bronchogenic carcinoma.  This is a new diagnosis.  Outpatient Clinics consulted for admission.  Pt currently stable.  Results have been discussed w/ her.   [name] Barron [place], [place] 05/30/11 1936

## 2011-05-30 NOTE — ED Notes (Signed)
Number found
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
## 2011-05-30 NOTE — ED Notes (Signed)
Pt cannot void at this time 

## 2011-05-30 NOTE — ED Notes (Signed)
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

## 2011-05-31 NOTE — ED Provider Notes (Signed)
Medical screening examination/treatment/procedure(s) were conducted as a shared visit with non-physician practitioner(s) and myself.  I personally evaluated the patient during the encounter  See previous note  Forbes Cellar, MD 05/31/11 [phone]

## 2011-05-31 NOTE — ED Provider Notes (Signed)
Medical screening examination/treatment/procedure(s) were conducted as a shared visit with non-physician practitioner(s) and myself.  I personally evaluated the patient during the encounter  Tachycardic. No chest pain. +SOB. Appears comfortable but anxious on exam. States this does not feel similar to panic/anxiety in the past. Not feeling completely better with ativan. 2 positive by PERC. CTA pending.  Forbes Cellar, MD 05/31/11 [phone]

## 2011-06-09 ENCOUNTER — Encounter: Payer: Self-pay | Admitting: Internal Medicine

## 2011-06-09 ENCOUNTER — Ambulatory Visit (INDEPENDENT_AMBULATORY_CARE_PROVIDER_SITE_OTHER): Payer: Medicare Other | Admitting: Internal Medicine

## 2011-06-09 VITALS — BP 152/94 | HR 87 | Temp 95.1°F | Ht 62.0 in | Wt 124.7 lb

## 2011-06-09 DIAGNOSIS — R918 Other nonspecific abnormal finding of lung field: Secondary | ICD-10-CM | POA: Insufficient documentation

## 2011-06-09 DIAGNOSIS — R5381 Other malaise: Secondary | ICD-10-CM

## 2011-06-09 DIAGNOSIS — R222 Localized swelling, mass and lump, trunk: Secondary | ICD-10-CM

## 2011-06-09 DIAGNOSIS — I10 Essential (primary) hypertension: Secondary | ICD-10-CM

## 2011-06-09 DIAGNOSIS — F329 Major depressive disorder, single episode, unspecified: Secondary | ICD-10-CM

## 2011-06-09 DIAGNOSIS — R531 Weakness: Secondary | ICD-10-CM

## 2011-06-09 NOTE — Assessment & Plan Note (Signed)
Patient has had a couple more "spells" since last visit with me.  She went to ED for being tired, anxious, having labored breathing, and multiple lung masses suspicious for cancer were found at that time.  She has been breathing well since then.  Suspect that these spells and her weakness is related to her likely cancer.
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
## 2011-06-09 NOTE — Progress Notes (Signed)
agree

## 2011-06-09 NOTE — Patient Instructions (Signed)
Return to clinic in 2 months.  Please call us if you have worsened SOB or weakness. Please keep your appointments with pulmonology Corinda Gubler) and Vesta Mixer

## 2011-06-09 NOTE — Assessment & Plan Note (Addendum)
CT Chest read: 1. No evidence of acute pulmonary embolus.  2. Right lower lobe lung mass most compatible with a bronchogenic  carcinoma measuring 19 x 22 x 27 mm. Subcarinal and right hilar  nodes measure 8 mm in short axis.  3. Multiple scattered additional noncalcified pulmonary nodules,  most 2-4 mm. However, a larger oval nodule in the lingula measuring  10 x 5 x 8 mm may represent a synchronous primary carcinoma.  Patient was already aware of likely diagnosis of lung cancer when she came today.  I discussed the above CT findings with patient and her godson.  Will refer patient to pulmonology for further evaluation/confirmation of diagnosis.  Explained to [name] Barron that the diagnosis of cancer is not confirmed, but the masses seen on CT scan are very suspicious for lung cancer.  Explained that a biopsy would likely be required to make a definitive diagnosis and then she may be referred to an oncologist if cancer is confirmed and pulmonology and patient decide on pursuing treatment.  Since diagnosis is uncertain at this time, I did not discuss treatment options or goals of care with patient.

## 2011-06-09 NOTE — Assessment & Plan Note (Signed)
Depressed mood is doing better than last visit with me per pt.  Her affect is happier today, which may be because she is accompanied today by her godson.  She slept through appt with [facility] yesterday, but she has another appt scheduled.  I encouraged her to keep this.

## 2011-06-09 NOTE — Assessment & Plan Note (Signed)
BP high today, was good last visit.  Possibly due to stress of her diagnosis.  Will not start antihypertensive at this time.

## 2011-06-17 ENCOUNTER — Ambulatory Visit (INDEPENDENT_AMBULATORY_CARE_PROVIDER_SITE_OTHER): Payer: Medicaid Other | Admitting: Internal Medicine

## 2011-06-17 ENCOUNTER — Encounter: Payer: Self-pay | Admitting: Internal Medicine

## 2011-06-17 VITALS — BP 152/90 | HR 130 | Temp 98.3°F | Ht 62.0 in | Wt 126.2 lb

## 2011-06-17 DIAGNOSIS — R918 Other nonspecific abnormal finding of lung field: Secondary | ICD-10-CM

## 2011-06-17 DIAGNOSIS — R222 Localized swelling, mass and lump, trunk: Secondary | ICD-10-CM

## 2011-06-17 DIAGNOSIS — R0600 Dyspnea, unspecified: Secondary | ICD-10-CM

## 2011-06-17 DIAGNOSIS — R0609 Other forms of dyspnea: Secondary | ICD-10-CM

## 2011-06-17 DIAGNOSIS — I10 Essential (primary) hypertension: Secondary | ICD-10-CM

## 2011-06-17 MED ORDER — NEBIVOLOL HCL 10 MG PO TABS: ORAL_TABLET | ORAL | Status: DC

## 2011-06-17 NOTE — Patient Instructions (Addendum)
Stop lisinopril and fish oil  Start bystolic 10 mg twice daily or one x 20 mg daily   Please schedule a follow up office visit in 4 weeks, sooner if needed with pft's on return

## 2011-06-17 NOTE — Progress Notes (Signed)
  Subjective:    Patient ID: [name] Barron, female    DOB: September 15, 1933, 78 y.o.   MRN: [id number]  HPI  65 yowf never smoker new onset breathing difficulty in early Jan 2013 > CT chest at ER > nodule > referred 06/17/2011 to pulmonary clinic for eval of nodule   06/17/2011 ov [facility] / 1st pulmonary on ACEI  cc spells of severe sob x 2-3 weeks lasting from a few secs to up to an hour typically not with exertion but assoc with dry cough and strangling and sensation of sinus drainage but no excess mucus and good ex tolerance otherwise  No previous h/o allergies asthma or bad reflux  Sleeping ok without nocturnal  or early am exacerbation  of respiratory  c/o's or need for noct saba. Also denies any obvious fluctuation of symptoms with weather or environmental changes or other aggravating or alleviating factors except as outlined above    Review of Systems  Constitutional: Positive for appetite change. Negative for fever, chills and unexpected weight change.  HENT: Positive for postnasal drip and sinus pressure. Negative for ear pain, nosebleeds, congestion, sore throat, rhinorrhea, sneezing, trouble swallowing, dental problem and voice change.   Eyes: Negative for visual disturbance.  Respiratory: Positive for cough and shortness of breath. Negative for choking.   Cardiovascular: Negative for chest pain and leg swelling.  Gastrointestinal: Positive for nausea and abdominal pain. Negative for vomiting and diarrhea.  Genitourinary: Negative for difficulty urinating.  Musculoskeletal: Negative for arthralgias.  Skin: Negative for rash.  Neurological: Negative for tremors, syncope and headaches.  Hematological: Does not bruise/bleed easily.       Objective:   Physical Exam  Anxious wf nad Wt  126 06/17/2011  HEENT: very poor dentition,  Nl  turbinates, and orophanx. Nl external ear canals without cough reflex   NECK :  without JVD/Nodes/TM/ nl carotid upstrokes bilaterally   LUNGS: no  acc muscle use, clear to A and P bilaterally without cough on insp or exp maneuvers   CV:  RRR  no s3 or murmur or increase in P2, no edema   ABD:  soft and nontender with nl excursion in the supine position. No bruits or organomegaly, bowel sounds nl  MS:  warm without deformities, calf tenderness, cyanosis or clubbing  SKIN: warm and dry without lesions    NEURO:  alert, approp, no deficits    cxr 05/30/11 is wnl CTangiogram 05/30/11 1. No evidence of acute pulmonary embolus.  2. Right lower lobe lung mass most compatible with a bronchogenic  carcinoma measuring 19 x 22 x 27 mm. Subcarinal and right hilar  nodes measure 8 mm in short axis.  3. Multiple scattered additional noncalcified pulmonary nodules,  most 2-4 mm. However, a larger oval nodule in the lingula measuring  10 x 5 x 8 mm may represent a synchronous primary carcinoma.      Assessment & Plan:

## 2011-06-18 DIAGNOSIS — R0600 Dyspnea, unspecified: Secondary | ICD-10-CM | POA: Insufficient documentation

## 2011-06-18 NOTE — Assessment & Plan Note (Signed)
Although there are clearly abnormalities on CT scan, they should probably be considered "microscopic" since not obvious on plain cxr .     In the setting of obvious "macroscopic" health issues (spells of sob at rest),  I am very reluctant to embark on an invasive w/u at this point but will arrange consevative  follow up and in the meantime see what we can do to address the patient's subjective concerns.    She is a never smoker and the multifocality of these lesions in view of the clinical syndrome in non-congruent   For now rec fix her breathing problem then regroup

## 2011-06-18 NOTE — Assessment & Plan Note (Signed)
Symptoms are markedly disproportionate to objective findings and not clear this is a lung problem but pt does appear to have difficult airway management issues. DDX of  difficult airways managment all start with A and  include Adherence, Ace Inhibitors, Acid Reflux, Active Sinus Disease, Alpha 1 Antitripsin deficiency, Anxiety masquerading as Airways dz,  ABPA,  allergy(esp in young), Aspiration (esp in elderly), Adverse effects of DPI,  Active smokers, plus two Bs  = Bronchiectasis and Beta blocker use..and one C= CHF  ACEI side effect is the leading suspect here since sob assoc with sense of choking  > try off then regroup in 4 weeks, sooner if spells continue

## 2011-06-18 NOTE — Assessment & Plan Note (Addendum)
ACE inhibitors are problematic in  pts with airway complaints because  even experienced pulmonologists can't always distinguish ace effects from copd/asthma.  By themselves they don't actually cause a problem, much like oxygen can't by itself start a fire, but they certainly serve as a powerful catalyst or enhancer for any "fire"  or inflammatory process in the upper airway, be it caused by an ET  tube or more commonly reflux (especially in the obese or pts with known GERD or who are on biphoshonates).   Since she is also tachycardic with poorly controlled bp  Strongly prefer in this setting: Bystolic, the most beta -1  selective Beta blocker available in sample form, with bisoprolol the most selective generic choice  on the market.     Try bystolic 10 mg bid

## 2011-06-26 ENCOUNTER — Institutional Professional Consult (permissible substitution): Payer: Medicare Other | Admitting: Internal Medicine

## 2011-07-16 ENCOUNTER — Encounter (INDEPENDENT_AMBULATORY_CARE_PROVIDER_SITE_OTHER): Payer: Medicare Other

## 2011-07-16 ENCOUNTER — Ambulatory Visit (INDEPENDENT_AMBULATORY_CARE_PROVIDER_SITE_OTHER): Payer: Medicare Other | Admitting: Internal Medicine

## 2011-07-16 ENCOUNTER — Encounter: Payer: Self-pay | Admitting: Internal Medicine

## 2011-07-16 DIAGNOSIS — R0989 Other specified symptoms and signs involving the circulatory and respiratory systems: Secondary | ICD-10-CM

## 2011-07-16 DIAGNOSIS — R0609 Other forms of dyspnea: Secondary | ICD-10-CM

## 2011-07-16 DIAGNOSIS — R0600 Dyspnea, unspecified: Secondary | ICD-10-CM

## 2011-07-16 DIAGNOSIS — I10 Essential (primary) hypertension: Secondary | ICD-10-CM

## 2011-07-16 DIAGNOSIS — R918 Other nonspecific abnormal finding of lung field: Secondary | ICD-10-CM

## 2011-07-16 DIAGNOSIS — J069 Acute upper respiratory infection, unspecified: Secondary | ICD-10-CM

## 2011-07-16 MED ORDER — AMOXICILLIN-POT CLAVULANATE 875-125 MG PO TABS: 1.0000 | ORAL_TABLET | Freq: Two times a day (BID) | ORAL | Status: AC

## 2011-07-16 NOTE — Assessment & Plan Note (Signed)
I had an extended discussion with the patient and son today lasting 15 to 20 minutes of a 25 minute visit on the following issues:   Since we can't see any changes on a plain cxr and since there are multiple nodules in this never smoker, Although there are clearly abnormalities on CT scan, they should probably be considered "microscopic" since not obvious on plain cxr .     In the setting of obvious "macroscopic" health issues, including what appears to me to be marked geriatric decline  I am very reluctatnt to embark on an invasive w/u at this point but will arrange consevative  follow up  In 3 months and consider w/u if any lesion evolves to where we can actually detect it easily on a plain film but she is clearly not a candidate for any form of early curative surgery.

## 2011-07-16 NOTE — Assessment & Plan Note (Signed)
Possible with assoc sinusits > rx with augmentin x 10 days then sinus ct if not better

## 2011-07-16 NOTE — Patient Instructions (Addendum)
Augmentin 875 twice daily x 10 days and if not 100% better then call [facility] for Sinus CT  Discuss with Dr Yaakov Guthrie how to manage your blood pressure but I don't feel you can be placed back on Lisinopril or any other ACE inhibitor.  Please schedule a follow up visit in 3 months but call sooner if needed with CXR .

## 2011-07-16 NOTE — Progress Notes (Signed)
  Subjective:    Patient ID: [name] Barron, female    DOB: November 22, 1933   MRN: [id number]   Brief patient profile:  6 yowf never smoker new onset breathing difficulty in early Jan 2013 > CT chest at ER > nodules > referred 06/17/2011 to pulmonary clinic for eval of nodules   06/17/2011 ov [name] Barron / 1st pulmonary on ACEI  cc spells of severe sob x 2-3 weeks lasting from a few secs to up to an hour typically not with exertion but assoc with dry cough and strangling and sensation of sinus drainage but no excess mucus and good ex tolerance otherwise No previous h/o allergies asthma or bad reflux rec Stop lisinopril and fish oil Start bystolic 10 mg twice daily or one x 20 mg daily  Please schedule a follow up office visit in 4 weeks, sooner if needed with pft's on return   07/16/2011 f/u ov/[name] Barron cc no breathing problems and dry cough gone but  "Worse today"- relates to "sinus infection"- nasal congestion and facial pressure/HA. Has occ prod cough with thick, yellow sputum not like the previous cough though and no assoc sob/ wheezing off acei.    Sleeping ok without nocturnal  or early am exacerbation  of respiratory  c/o's or need for noct saba. Also denies any obvious fluctuation of symptoms with weather or environmental changes or other aggravating or alleviating factors except as outlined above   Sleeping ok without nocturnal  or early am exacerbation  of respiratory  c/o's or need for noct saba. Also denies any obvious fluctuation of symptoms with weather or environmental changes or other aggravating or alleviating factors except as outlined above         Objective:   Physical Exam  Anxious amb  wf nad but looks a good 90 y older than stated age  Wt  126 06/17/2011 > 07/16/2011  124   HEENT: very poor dentition,  Nl  turbinates, and orophanx. Nl external ear canals without cough reflex   NECK :  without JVD/Nodes/TM/ nl carotid upstrokes bilaterally   LUNGS: no acc muscle use, clear  to A and P bilaterally without cough on insp or exp maneuvers   CV:  RRR  no s3 or murmur or increase in P2, no edema   ABD:  soft and nontender with nl excursion in the supine position. No bruits or organomegaly, bowel sounds nl  MS:  warm without deformities, calf tenderness, cyanosis or clubbing      cxr 05/30/11 is wnl CTangiogram 05/30/11 1. No evidence of acute pulmonary embolus.  2. Right lower lobe lung mass most compatible with a bronchogenic  carcinoma measuring 19 x 22 x 27 mm. Subcarinal and right hilar  nodes measure 8 mm in short axis.  3. Multiple scattered additional noncalcified pulmonary nodules,  most 2-4 mm. However, a larger oval nodule in the lingula measuring  10 x 5 x 8 mm may represent a synchronous primary carcinoma.      Assessment & Plan:

## 2011-07-16 NOTE — Assessment & Plan Note (Signed)
Strongly prefer in this setting: Bystolic, the most beta -1  selective Beta blocker available in sample form, with bisoprolol the most selective generic choice  on the market > defer final rx to primary care

## 2011-07-16 NOTE — Assessment & Plan Note (Signed)
Clearly better off acei but not able to do pft's today and generally has more geriatric than pulmonary decline so no further w/u needed at this point as long as her spells don't recur off ace.  If so, consider  Classic Upper airway cough syndrome, so named because it's frequently impossible to sort out how much is  CR/sinusitis with freq throat clearing (which can be related to primary GERD)   vs  causing  secondary (" extra esophageal")  GERD from wide swings in gastric pressure that occur with throat clearing, often  promoting self use of mint and menthol lozenges that reduce the lower esophageal sphincter tone and exacerbate the problem further in a cyclical fashion.   These are the same pts (now being labeled as having "irritable larynx syndrome" by some cough centers) who not infrequently have a history of having failed to tolerate ace inhibitors,  dry powder inhalers or biphosphonates or report having atypical reflux symptoms that don't respond to standard doses of PPI , and are easily confused as having aecopd or asthma flares by even experienced allergists/ pulmonologists.

## 2011-07-17 ENCOUNTER — Encounter: Payer: Medicare Other | Admitting: Internal Medicine

## 2011-08-17 ENCOUNTER — Ambulatory Visit (INDEPENDENT_AMBULATORY_CARE_PROVIDER_SITE_OTHER): Payer: Medicare Other | Admitting: Internal Medicine

## 2011-08-17 ENCOUNTER — Encounter: Payer: Self-pay | Admitting: Internal Medicine

## 2011-08-17 VITALS — BP 121/74 | HR 70 | Temp 98.7°F | Ht 62.0 in | Wt 127.5 lb

## 2011-08-17 DIAGNOSIS — F3289 Other specified depressive episodes: Secondary | ICD-10-CM

## 2011-08-17 DIAGNOSIS — J309 Allergic rhinitis, unspecified: Secondary | ICD-10-CM

## 2011-08-17 DIAGNOSIS — I10 Essential (primary) hypertension: Secondary | ICD-10-CM

## 2011-08-17 DIAGNOSIS — R911 Solitary pulmonary nodule: Secondary | ICD-10-CM

## 2011-08-17 DIAGNOSIS — F329 Major depressive disorder, single episode, unspecified: Secondary | ICD-10-CM

## 2011-08-17 DIAGNOSIS — R918 Other nonspecific abnormal finding of lung field: Secondary | ICD-10-CM

## 2011-08-17 MED ORDER — CETIRIZINE HCL 10 MG PO TABS: 10.0000 mg | ORAL_TABLET | Freq: Every day | ORAL | Status: DC | PRN

## 2011-08-17 NOTE — Patient Instructions (Signed)
Please return to clinic in 3-4 months for a blood pressure check

## 2011-08-20 NOTE — Assessment & Plan Note (Signed)
Is not taking Bystolic and BP looks great today.  Pulse good as well.  Does not need any medication for BP at this time.

## 2011-08-20 NOTE — Assessment & Plan Note (Addendum)
Saw pulmonary twice, they are being watched closely.  It was felt that her SOB spells in January were possibly ACE inhibitor adverse rxn, and when this was discontinued her spells improved.  To fu with pulmonary again in May or June 2013
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
## 2011-08-20 NOTE — Assessment & Plan Note (Signed)
Reports that her mood is up and down.  No active SI or HI.  Has passive thoughts that it may be better if she were dead, but never any thoughts of acting on this.  Saw [facility] 2-3 weeks ago and her meds were changed.  However, she does not know names of her new meds.  Records request sent to [facility] today.  We strongly advised her to return to [facility] in 1-2 weeks since she is not taking the meds they recommended because she was told at least one is for sleep, and she does not want to take sleeping medication.  She is still taking Wellbutrin, which [facility] wanted her to stop and take the new meds in place of.

## 2011-09-29 ENCOUNTER — Other Ambulatory Visit (HOSPITAL_COMMUNITY): Payer: Self-pay | Admitting: Family Medicine

## 2011-09-29 DIAGNOSIS — Z1231 Encounter for screening mammogram for malignant neoplasm of breast: Secondary | ICD-10-CM

## 2011-10-14 ENCOUNTER — Other Ambulatory Visit: Payer: Self-pay | Admitting: Family Medicine

## 2011-10-14 ENCOUNTER — Ambulatory Visit (INDEPENDENT_AMBULATORY_CARE_PROVIDER_SITE_OTHER)
Admission: RE | Admit: 2011-10-14 | Discharge: 2011-10-14 | Disposition: A | Discharge status: Home / Self Care | Payer: Medicaid Other | Source: Ambulatory Visit | Attending: Internal Medicine | Admitting: Internal Medicine

## 2011-10-14 ENCOUNTER — Encounter: Payer: Self-pay | Admitting: Internal Medicine

## 2011-10-14 ENCOUNTER — Ambulatory Visit (INDEPENDENT_AMBULATORY_CARE_PROVIDER_SITE_OTHER): Payer: Medicaid Other | Admitting: Internal Medicine

## 2011-10-14 VITALS — BP 120/80 | HR 90 | Temp 98.4°F | Ht 62.0 in | Wt 125.0 lb

## 2011-10-14 DIAGNOSIS — R0989 Other specified symptoms and signs involving the circulatory and respiratory systems: Secondary | ICD-10-CM

## 2011-10-14 DIAGNOSIS — R918 Other nonspecific abnormal finding of lung field: Secondary | ICD-10-CM

## 2011-10-14 DIAGNOSIS — R0600 Dyspnea, unspecified: Secondary | ICD-10-CM

## 2011-10-14 DIAGNOSIS — J329 Chronic sinusitis, unspecified: Secondary | ICD-10-CM

## 2011-10-14 NOTE — Assessment & Plan Note (Signed)
Sinus ct by Dr Dorothe Pea > agree with eval and rx

## 2011-10-14 NOTE — Patient Instructions (Signed)
Keep appointment for your sinus CT as ordered by Dr Dorothe Pea  Please schedule a follow up visit in 3 months but call sooner if needed with a cxr

## 2011-10-14 NOTE — Progress Notes (Signed)
Subjective:    Patient ID: [name] Barron, female    DOB: Sep 12, 1933   MRN: [id number]   Brief patient profile:  50 yowf never smoker new onset breathing difficulty in early Jan 2013 > CT chest at ER > nodules > referred 06/17/2011 to pulmonary clinic for eval of nodules   06/17/2011 ov [name] / 1st pulmonary on ACEI  cc spells of severe sob x 2-3 weeks lasting from a few secs to up to an hour typically not with exertion but assoc with dry cough and strangling and sensation of sinus drainage but no excess mucus and good ex tolerance otherwise No previous h/o allergies asthma or bad reflux rec Stop lisinopril and fish oil Start bystolic 10 mg twice daily or one x 20 mg daily  Please schedule a follow up office visit in 4 weeks, sooner if needed with pft's on return   07/16/2011 f/u ov/[name] cc no breathing problems and dry cough gone but  "Worse today"- relates to "sinus infection"- nasal congestion and facial pressure/HA. Has occ prod cough with thick, yellow sputum not like the previous cough though and no assoc sob/ wheezing off acei. rec Augmentin 875 twice daily x 10 days and if not 100% better then call [facility] for Sinus CT Discuss with Dr Yaakov Guthrie how to manage your blood pressure but I don't feel you can be placed back on Lisinopril or any other ACE inhibitor. Please schedule a follow up visit in 3 months but call sooner if needed with CXR .    10/14/2011 f/u ov/[name] cc breathing fine now, does not remember previous instructions or response to rx from 2/28  but persistent x 2 months c/o nose stuffed up and congested and coughing up white mucus, some nasal yellow draingage > Dr Dorothe Pea eval and rec sinus ct today but pt confused with instructions, names of meds/ recs etc.  No purulent sputum. No obvious daytime variabilty or assoc chronic cough or cp or chest tightness, subjective wheeze overt sinus or hb symptoms. No unusual exp hx      Sleeping ok without nocturnal  or early am  exacerbation  of respiratory  c/o's or need for noct saba. Also denies any obvious fluctuation of symptoms with weather or environmental changes or other aggravating or alleviating factors except as outlined above   ROS  At present neg for  any significant sore throat, dysphagia, dental problems, itching, sneezing,  ear ache,   fever, chills, sweats, unintended wt loss, pleuritic or exertional cp, hemoptysis, palpitations, orthopnea pnd or leg swelling.  Also denies presyncope, palpitations, heartburn, abdominal pain, anorexia, nausea, vomiting, diarrhea  or change in bowel or urinary habits, change in stools or urine, dysuria,hematuria,  rash, arthralgias, visual complaints, headache, numbness weakness or ataxia or problems with walking or coordination. No noted change in mood/affect or memory.                           Objective:   Physical Exam    amb  wf nad but looks a good 37 y older than stated age  Wt  126 06/17/2011 > 07/16/2011  124 > 10/14/2011  125   HEENT: very poor dentition,  Nl  turbinates, and orophanx. Nl external ear canals without cough reflex   NECK :  without JVD/Nodes/TM/ nl carotid upstrokes bilaterally   LUNGS: no acc muscle use, clear to A and P bilaterally without cough on insp or exp maneuvers  CV:  RRR  no s3 or murmur or increase in P2, no edema   ABD:  soft and nontender with nl excursion in the supine position. No bruits or organomegaly, bowel sounds nl  MS:  warm without deformities, calf tenderness, cyanosis or clubbing      cxr 05/30/11 is wnl CTangiogram 05/30/11 1. No evidence of acute pulmonary embolus.  2. Right lower lobe lung mass most compatible with a bronchogenic  carcinoma measuring 19 x 22 x 27 mm. Subcarinal and right hilar  nodes measure 8 mm in short axis.  3. Multiple scattered additional noncalcified pulmonary nodules,  most 2-4 mm. However, a larger oval nodule in the lingula measuring  10 x 5 x 8 mm may represent a  synchronous primary carcinoma.  CXR  10/14/2011 :   No visible pulmonary nodules or other acute disease. No change since the prior chest x-ray of 05/30/2011.      Assessment & Plan:

## 2011-10-14 NOTE — Assessment & Plan Note (Signed)
Trial off ACEI  Started 06/18/2011 >  Resolved 10/14/2011   No further w/u or f/u needed for this problem

## 2011-10-14 NOTE — Assessment & Plan Note (Signed)
cxr reviewed and is completely nl  Although there are clearly abnormalities on CT scan, they should probably be considered "microscopic" since not obvious on plain cxr .     In the setting of obvious "macroscopic" health issues in a patient who looks a good 10 years older than stated age and having trouble processing the simplest of recs,  I am very reluctatnt to embark on an invasive w/u at this point but will arrange consevative  follow up and in the meantime see what we can do to address the patient's subjective concerns > agree with sinus ct next step

## 2011-10-16 ENCOUNTER — Other Ambulatory Visit: Payer: Medicaid Other

## 2011-10-27 ENCOUNTER — Ambulatory Visit (HOSPITAL_COMMUNITY): Payer: Medicaid Other | Attending: Family Medicine

## 2012-01-13 ENCOUNTER — Other Ambulatory Visit: Payer: Self-pay | Admitting: Internal Medicine

## 2012-01-13 DIAGNOSIS — R918 Other nonspecific abnormal finding of lung field: Secondary | ICD-10-CM

## 2012-01-14 ENCOUNTER — Ambulatory Visit: Payer: Medicaid Other | Admitting: Internal Medicine

## 2012-02-08 ENCOUNTER — Other Ambulatory Visit: Payer: Self-pay | Admitting: Internal Medicine

## 2012-02-08 DIAGNOSIS — R918 Other nonspecific abnormal finding of lung field: Secondary | ICD-10-CM

## 2012-02-09 ENCOUNTER — Encounter: Payer: Medicaid Other | Admitting: Internal Medicine

## 2012-02-09 NOTE — Progress Notes (Signed)
 This encounter was created in error - please disregard.

## 2012-11-30 ENCOUNTER — Encounter: Payer: Self-pay | Admitting: Internal Medicine

## 2013-04-21 IMAGING — CT CT ANGIO CHEST
2 of 7 series · 18 of 36 positions shown · IV contrast (CONTRAST)
Comparison: Chest radiograph from earlier the same day.

CLINICAL DATA: 77-year-old female with shortness of breath,
dizziness.

CT ANGIOGRAPHY CHEST
TECHNIQUE: Multidetector CT imaging of the chest using the
standard protocol during bolus administration of intravenous
contrast. Multiplanar reconstructed images including MIPs were
obtained and reviewed to evaluate the vascular anatomy.
Contrast: 80mL OMNIPAQUE IOHEXOL 350 MG/ML IV SOLN

[Series 5: pe thins · axial · 0.69mm/px · z∈[-227,+7]mm · 17 of 264 slices shown]
[im 15/264  lung]
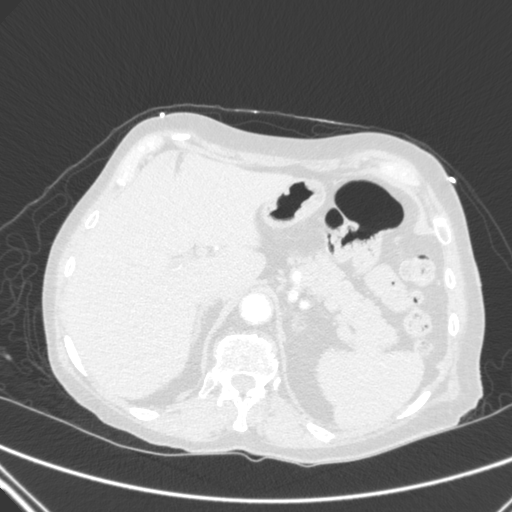
[im 30/264  mediastinal]
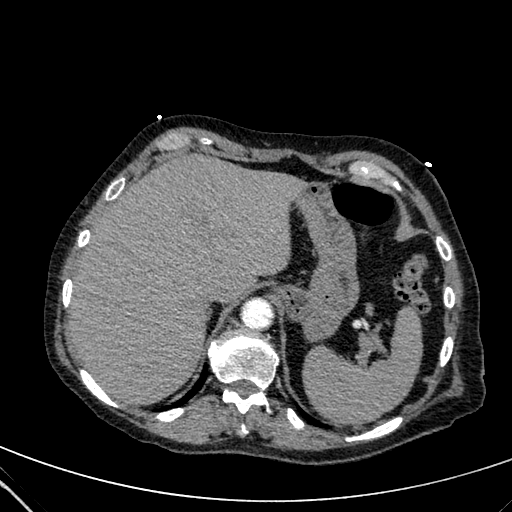
[im 44/264  lung]
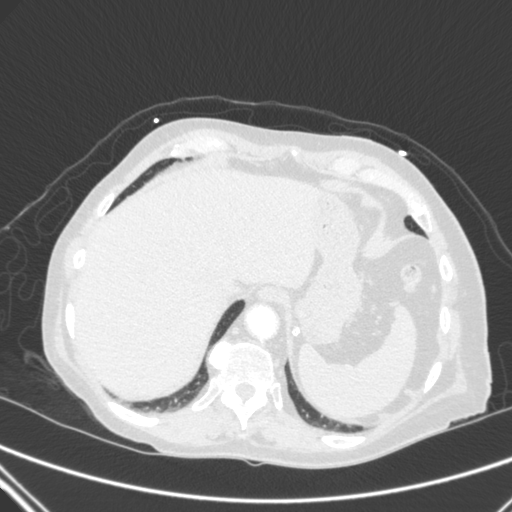
[im 59/264  mediastinal]
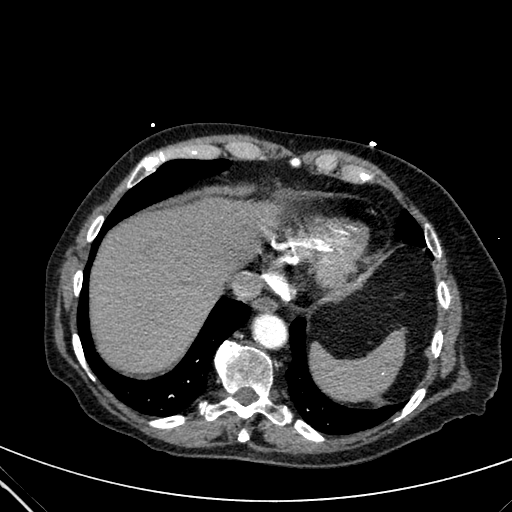
[im 74/264  lung]
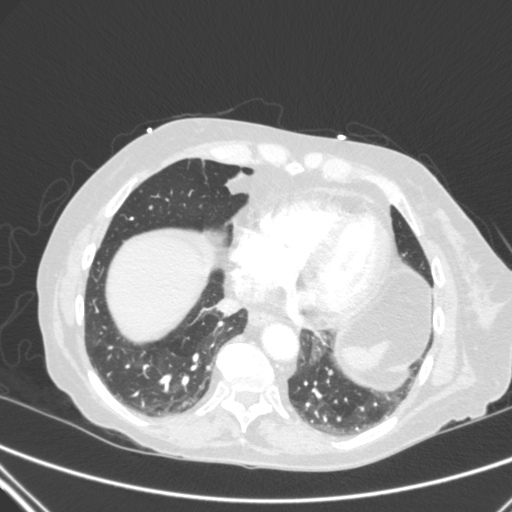
[im 88/264  mediastinal]
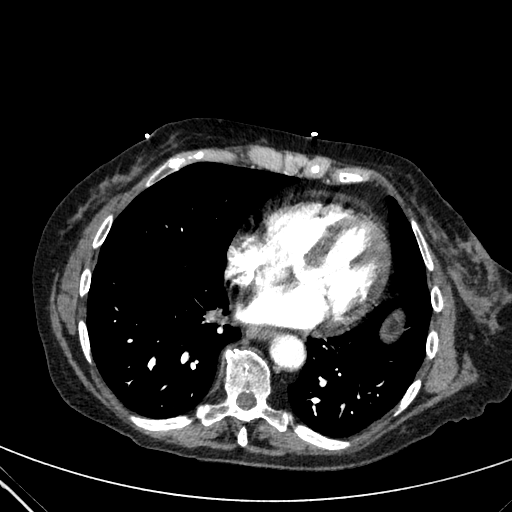
[im 103/264  lung]
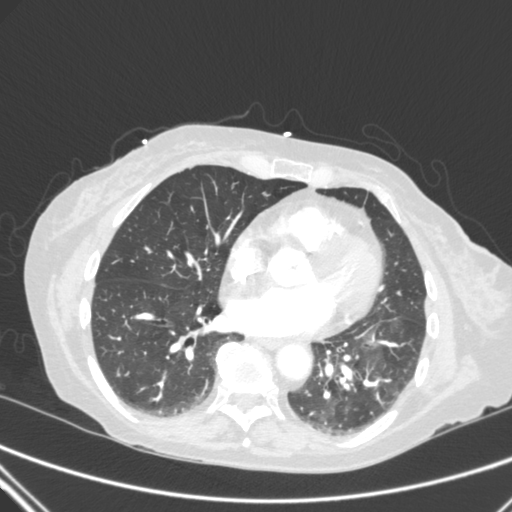
[im 117/264  mediastinal]
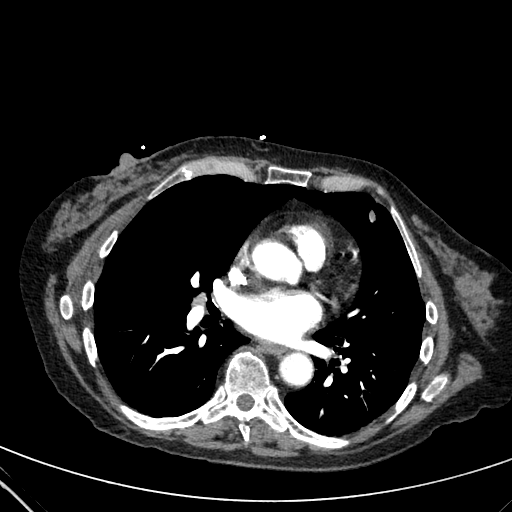
[im 132/264  lung]
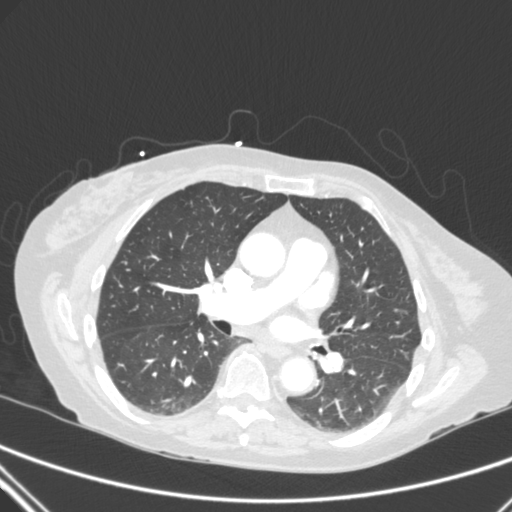
[im 147/264  mediastinal]
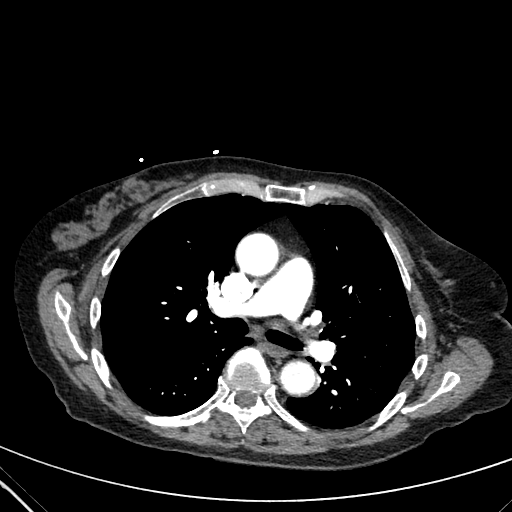
[im 161/264  lung]
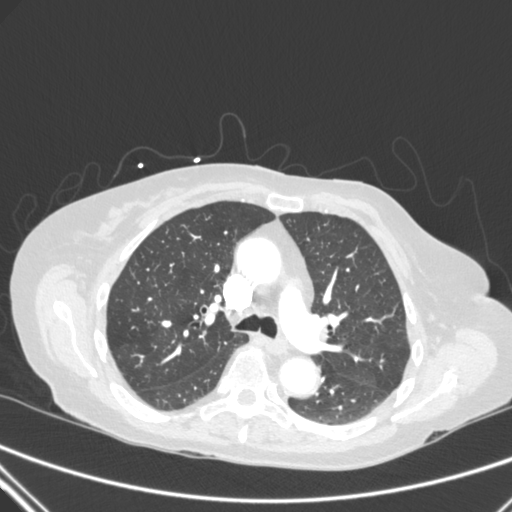
[im 176/264  mediastinal]
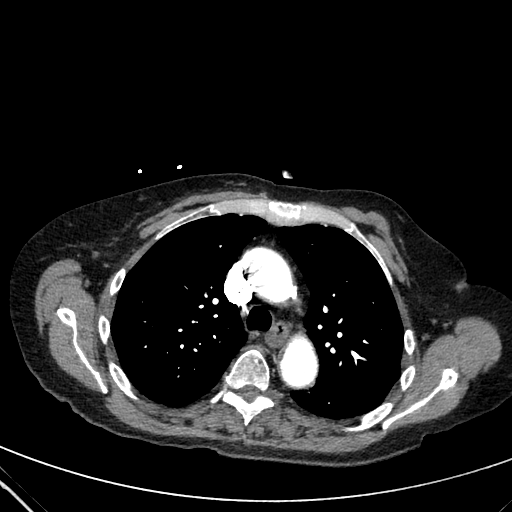
[im 190/264  lung]
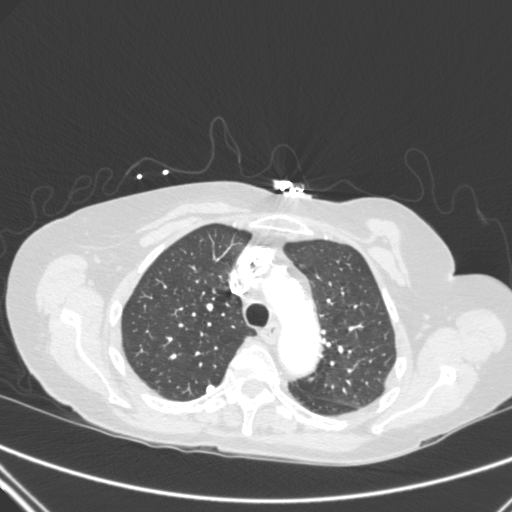
[im 205/264  mediastinal]
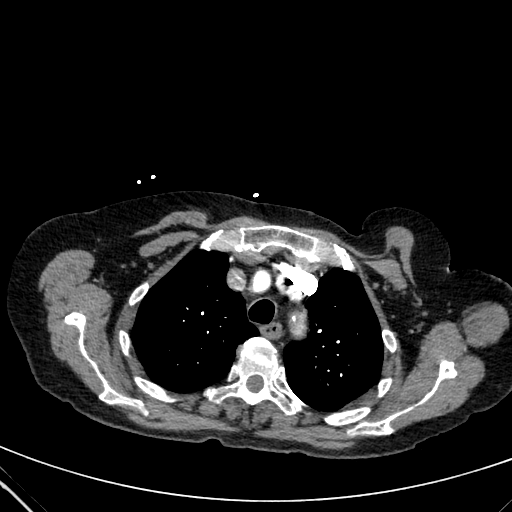
[im 220/264  lung]
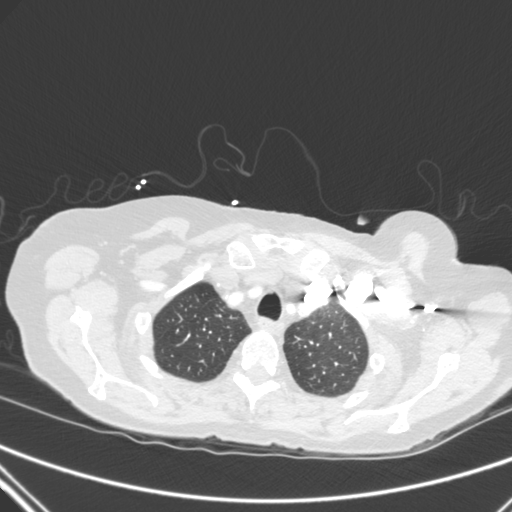
[im 234/264  mediastinal]
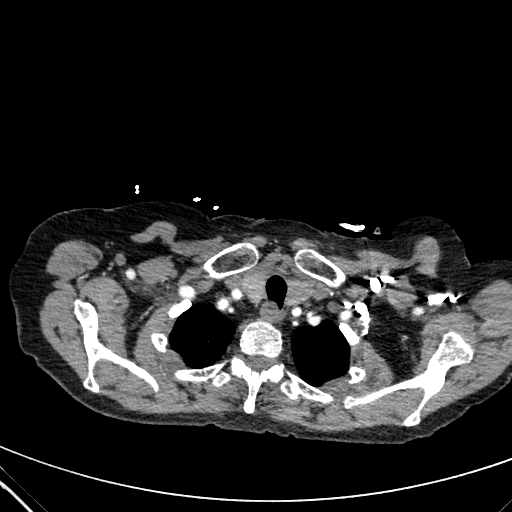
[im 249/264  lung]
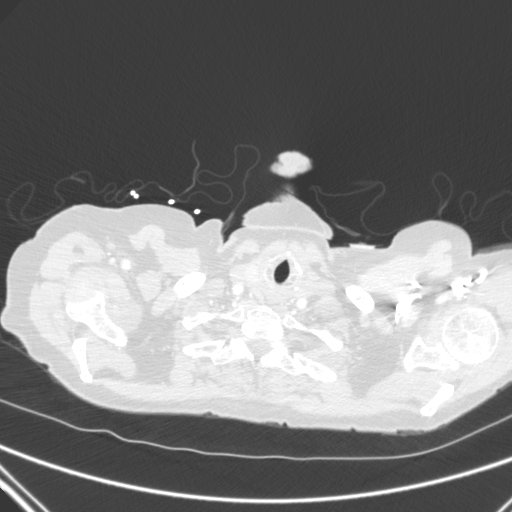

[mpr, coronals, coronal · coronal · 0.69mm/px · 1 of 96 slices shown]
[im 48/96  mediastinal]
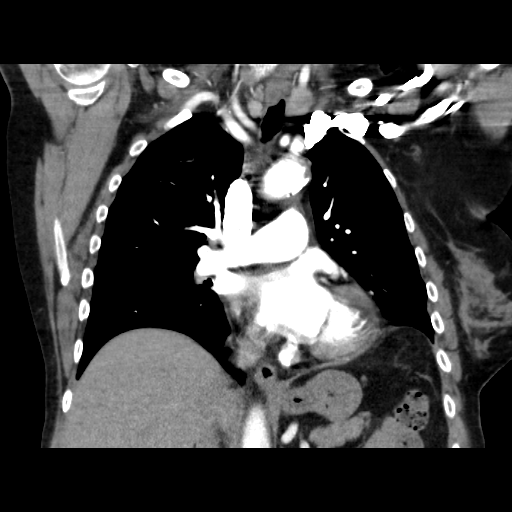

[18 of 36 positions shown; findings below may reference images not displayed]

FINDINGS: Good contrast bolus timing in the pulmonary arterial
tree.  No focal filling defect identified in the pulmonary arterial
tree to suggest the presence of acute pulmonary embolism.

There is a right lower lobe lung mass bordering the as ago
esophageal recess measuring 19 x 22 x 27 mm.  This appears to
involve some small vessel.  Right hilar lymph nodes measure up to 8
mm.  Subcarinal lymph nodes are not is enlarged.  The lesion
closely abuts the pericardial fat but no pericardial effusion is
present.  No pleural effusion.

Lung parenchyma elsewhere is remarkable for dependent atelectasis,
occasional calcified granulomas, and scattered noncalcified 2-4 mm
lung nodules in both lungs.  There is also an oval 10 x 5 x 8 mm
nodule in the lingula anteriorly.

Major airways are patent.  Negative visualized upper abdominal
viscera; there is a small sub centimeter fatty lesion at the dome
of the liver.  Negative visualized aorta except for
atherosclerosis.  Negative visualized thoracic inlet.

No acute or suspicious osseous lesion identified.
IMPRESSION: 1. No evidence of acute pulmonary embolus.
2.  Right lower lobe lung mass most compatible with a bronchogenic
carcinoma measuring 19 x 22 x 27 mm.  Subcarinal and right hilar
nodes measure 8 mm in short axis.
3.  Multiple scattered additional noncalcified pulmonary nodules,
most 2-4 mm. However, a larger oval nodule in the lingula measuring
10 x 5 x 8 mm may represent a synchronous primary carcinoma.

## 2013-06-08 ENCOUNTER — Encounter (HOSPITAL_COMMUNITY): Payer: Self-pay | Admitting: Emergency Medicine

## 2013-06-08 ENCOUNTER — Emergency Department (HOSPITAL_COMMUNITY)
Admission: EM | Admit: 2013-06-08 | Discharge: 2013-06-08 | Disposition: A | Discharge status: Home / Self Care | Payer: Medicare (Managed Care) | Attending: Emergency Medicine | Admitting: Emergency Medicine

## 2013-06-08 ENCOUNTER — Emergency Department (HOSPITAL_COMMUNITY): Payer: Medicare (Managed Care)

## 2013-06-08 DIAGNOSIS — W1809XA Striking against other object with subsequent fall, initial encounter: Secondary | ICD-10-CM | POA: Insufficient documentation

## 2013-06-08 DIAGNOSIS — Y9289 Other specified places as the place of occurrence of the external cause: Secondary | ICD-10-CM | POA: Insufficient documentation

## 2013-06-08 DIAGNOSIS — F411 Generalized anxiety disorder: Secondary | ICD-10-CM | POA: Insufficient documentation

## 2013-06-08 DIAGNOSIS — W19XXXA Unspecified fall, initial encounter: Secondary | ICD-10-CM

## 2013-06-08 DIAGNOSIS — Y9302 Activity, running: Secondary | ICD-10-CM | POA: Insufficient documentation

## 2013-06-08 DIAGNOSIS — M899 Disorder of bone, unspecified: Secondary | ICD-10-CM | POA: Insufficient documentation

## 2013-06-08 DIAGNOSIS — IMO0002 Reserved for concepts with insufficient information to code with codable children: Secondary | ICD-10-CM | POA: Insufficient documentation

## 2013-06-08 DIAGNOSIS — S1093XA Contusion of unspecified part of neck, initial encounter: Principal | ICD-10-CM

## 2013-06-08 DIAGNOSIS — I10 Essential (primary) hypertension: Secondary | ICD-10-CM | POA: Insufficient documentation

## 2013-06-08 DIAGNOSIS — M949 Disorder of cartilage, unspecified: Secondary | ICD-10-CM

## 2013-06-08 DIAGNOSIS — Z5987 Material hardship due to limited financial resources, not elsewhere classified: Secondary | ICD-10-CM | POA: Insufficient documentation

## 2013-06-08 DIAGNOSIS — F329 Major depressive disorder, single episode, unspecified: Secondary | ICD-10-CM | POA: Insufficient documentation

## 2013-06-08 DIAGNOSIS — Z860100 Personal history of colon polyps, unspecified: Secondary | ICD-10-CM | POA: Insufficient documentation

## 2013-06-08 DIAGNOSIS — Z8719 Personal history of other diseases of the digestive system: Secondary | ICD-10-CM | POA: Insufficient documentation

## 2013-06-08 DIAGNOSIS — Z79899 Other long term (current) drug therapy: Secondary | ICD-10-CM | POA: Insufficient documentation

## 2013-06-08 DIAGNOSIS — Z598 Other problems related to housing and economic circumstances: Secondary | ICD-10-CM | POA: Insufficient documentation

## 2013-06-08 DIAGNOSIS — F3289 Other specified depressive episodes: Secondary | ICD-10-CM | POA: Insufficient documentation

## 2013-06-08 DIAGNOSIS — Z8709 Personal history of other diseases of the respiratory system: Secondary | ICD-10-CM | POA: Insufficient documentation

## 2013-06-08 DIAGNOSIS — S0083XA Contusion of other part of head, initial encounter: Principal | ICD-10-CM

## 2013-06-08 DIAGNOSIS — S0003XA Contusion of scalp, initial encounter: Secondary | ICD-10-CM

## 2013-06-08 MED ORDER — SODIUM CHLORIDE 0.9 % IV BOLUS (SEPSIS): 500.0000 mL | Freq: Once | INTRAVENOUS | Status: DC

## 2013-06-08 MED ORDER — METOPROLOL TARTRATE 25 MG PO TABS
50.0000 mg | ORAL_TABLET | Freq: Once | ORAL | Status: AC
  Administered 2013-06-08: 50 mg via ORAL
  Filled 2013-06-08: qty 2

## 2013-06-08 MED ORDER — ACETAMINOPHEN 325 MG PO TABS
650.0000 mg | ORAL_TABLET | Freq: Once | ORAL | Status: AC
  Administered 2013-06-08: 650 mg via ORAL
  Filled 2013-06-08: qty 2

## 2013-06-08 NOTE — ED Notes (Signed)
Patient discharged to home with family. NAD.  

## 2013-06-08 NOTE — ED Provider Notes (Signed)
CSN: [id number]     Arrival date & time 06/08/13  1405 History   First MD Initiated Contact with Patient 06/08/13 1409     Chief Complaint  Patient presents with  . Fall   (Consider location/radiation/quality/duration/timing/severity/associated sxs/prior Treatment) Patient is a 80 y.o. female presenting with fall. The history is provided by the patient and medical records.  Fall  This is a 80 year old female with past medical history significant for hypertension, depression, anxiety, presenting to the ED for head injury. Patient states she was at a [employer]und[employer], stepped backwards and ran into a shopping cart causing her to fall and hit her head. Patient denies loss of consciousness. There is a small abrasion to top of scalp, however patient states this has been there for several weeks and is not new today. Patient denies any headache, dizziness, visual disturbance, tinnitus, changes in speech, unilateral weakness, or difficulty walking.  Pt is not currently on any anti-coagulants.  Pt denies any other injuries or complaints at this time.  Past Medical History  Diagnosis Date  . Anxiety   . Depression   . Hypertension   . Diverticulosis of colon   . Tubular adenoma of colon     Unclear history. Unclear as to when the colonoscopy was done. Appeared she was referred to GI for a repeat colonoscopy in 2009. No records in EMR. Positive hemoccult in 1997  . Chronic sinusitis   . Inadequate material resources   . Osteopenia     DEXA in 2009   Past Surgical History  Procedure Laterality Date  . Abdominal hysterectomy  1970s   History reviewed. No pertinent family history. History  Substance Use Topics  . Smoking status: Never Smoker   . Smokeless tobacco: Never Used  . Alcohol Use: No   OB History   Grav Para Term Preterm Abortions TAB SAB Ect Mult Living                 Review of Systems  HENT:       Fall  All other systems reviewed and are negative.    Allergies  Ace  inhibitors  Home Medications   Current Outpatient Rx  Name  Route  Sig  Dispense  Refill  . Calcium Carb-Cholecalciferol 600-400 MG-UNIT TABS   Oral   Take 1 tablet by mouth 2 (two) times daily.          Marland Kitchen EXPIRED: fluticasone (FLONASE) 50 MCG/ACT nasal spray   Nasal   Place 2 sprays into the nose 2 (two) times daily.   16 g   2    BP 177/96  Pulse 125  Temp(Src) 99.4 F (37.4 C) (Oral)  Resp 20  SpO2 100%  LMP 05/04/2011  Physical Exam  Nursing note and vitals reviewed. Constitutional: She is oriented to person, place, and time. She appears well-developed and well-nourished. No distress.  HENT:  Head: Normocephalic and atraumatic. Head is without raccoon's eyes, without Battle's sign, without abrasion and without laceration. Hair is normal.  Mouth/Throat: Uvula is midline and oropharynx is clear and moist.  Small abrasion to top of scalp without active bleeding (pt states present for the past 2-3 weeks)  Eyes: Conjunctivae, EOM and lids are normal. Pupils are equal, round, and reactive to light.  Neck: Normal range of motion. Neck supple.  Cardiovascular: Normal rate, regular rhythm and normal heart sounds.   Pulmonary/Chest: Effort normal and breath sounds normal. No respiratory distress. She has no wheezes.  Musculoskeletal: Normal range of motion.  She exhibits no edema.  Neurological: She is alert and oriented to person, place, and time. She has normal strength. She displays no tremor. No cranial nerve deficit or sensory deficit. She displays no seizure activity.  AAOx3, answering questions appropriately; equal strength UE and LE bilaterally; CN grossly intact; moves all extremities appropriately without ataxia; no focal neuro deficits or facial asymmetry appreciated  Skin: Skin is warm and dry. She is not diaphoretic.  Psychiatric: She has a normal mood and affect.    ED Course  Procedures (including critical care time) Labs Review Labs Reviewed - No data to  display Imaging Review Ct Head Wo Contrast  06/08/2013   CLINICAL DATA:  Trip and fall, head trauma.  EXAM: CT HEAD WITHOUT CONTRAST  TECHNIQUE: Contiguous axial images were obtained from the base of the skull through the vertex without intravenous contrast.  COMPARISON:  None available for comparison at time of study interpretation.  FINDINGS: The ventricles and sulci are normal for age. No intraparenchymal hemorrhage, mass effect nor midline shift. Confluent supratentorial white matter hypodensities are within normal range for patient's age and though non-specific suggest sequelae of chronic small vessel ischemic disease. No acute large vascular territory infarcts. Bilateral basal ganglia and bilateral thalamic subcentimeter hypodensities .  No abnormal extra-axial fluid collections. Basal cisterns are patent. Moderate calcific atherosclerosis of the carotid siphons.  Small right frontoparietal scalp hematoma. No skull fracture. Osteopenia appear Visualized paranasal sinuses and mastoid aircells are well-aerated. The included ocular globes and orbital contents are non-suspicious.  IMPRESSION: Small right frontoparietal scalp hematoma without underlying skull fracture. No acute intracranial process.  Severe white matter changes suggest chronic small vessel ischemic disease with bilateral basal ganglia and thalamic lacunar infarcts.   Electronically Signed   By: Elon Alas   On: 06/08/2013 15:39    EKG Interpretation   None       MDM   1. Fall   2. Scalp hematoma    Pt with head injury after tripping over shopping cart at [employer].  Not on anti-coagulants, neuro exam without focal deficits.  Will obtain screening CT head.  pts BP and HR elevated on arrival-- pt states she forgot to take her HTN meds this morning.  Will give home dose here in the ED as well as IVFB.  CT head  revealing scalp hematoma, no intracranial hemorrhage or hematoma. Patient's neuro exam remains normal. She has no  signs or symptoms concerning for concussion. She'll be discharged home with her son who is at bedside. Patient's blood pressure and heart rate have stabilized after home dose of toprol.  Pt will FU with her PCP if problems occur.  Strict return precautions advised for new or worsening symptoms including dizziness, unilateral weakness, visual disturbance, slurred speech, or ataxic gait.  Larene Pickett, PA-C 06/08/13 [phone]

## 2013-06-08 NOTE — ED Notes (Signed)
Patient presents to ED via EMS after she tripped over shopping cart hitting her head. No LOC. Very small abrasion to top of head. No complaints from patient.

## 2013-06-08 NOTE — ED Provider Notes (Signed)
Medical screening examination/treatment/procedure(s) were conducted as a shared visit with non-physician practitioner(s) and myself.  I personally evaluated the patient during the encounter.  EKG Interpretation    Date/Time:  Thursday June 08 2013 14:07:18 EST Ventricular Rate:  125 PR Interval:  133 QRS Duration: 127 QT Interval:  337 QTC Calculation: 486 R Axis:   76 Text Interpretation:  Age not entered, assumed to be  80 years old for purpose of ECG interpretation Sinus tachycardia Right bundle branch block Confirmed by Nieve Rojero  MD, [place] ([id number]) on 06/08/2013 2:23:14 PM            I interviewed and examined the patient. Lungs are CTAB. Cardiac exam wnl. Abdomen soft. Mild abrasion to vertex of head. Pt appears well on exam. Is hypertensive w/ elev HR. She notes she did not take her BP medicine this morning. Her NP came by the ED and noted that she is non-compliant w/ her meds as well. Will give home meds.   BP and HR decreasing appropriately, pt continues to appear well. CT neg. Will d/c home.   Blanchard Kelch, MD 06/08/13 [phone]

## 2013-06-08 NOTE — Discharge Instructions (Signed)
Continue taking all home medications as directed. Follow up with your primary care physician if problems occur. Return to the ED for new or worsening symptoms-- severe headache, dizziness, unilateral weakness, numbness, blurred vision, nausea, vomiting, etc.

## 2015-02-14 ENCOUNTER — Emergency Department (HOSPITAL_COMMUNITY)
Admission: EM | Admit: 2015-02-14 | Discharge: 2015-02-14 | Disposition: A | Discharge status: Home / Self Care | Payer: Medicare (Managed Care) | Attending: Emergency Medicine | Admitting: Emergency Medicine

## 2015-02-14 ENCOUNTER — Emergency Department (HOSPITAL_COMMUNITY): Payer: Medicare (Managed Care)

## 2015-02-14 ENCOUNTER — Encounter (HOSPITAL_COMMUNITY): Payer: Self-pay | Admitting: Emergency Medicine

## 2015-02-14 DIAGNOSIS — F329 Major depressive disorder, single episode, unspecified: Secondary | ICD-10-CM | POA: Insufficient documentation

## 2015-02-14 DIAGNOSIS — R531 Weakness: Secondary | ICD-10-CM | POA: Diagnosis present

## 2015-02-14 DIAGNOSIS — Y9389 Activity, other specified: Secondary | ICD-10-CM | POA: Diagnosis not present

## 2015-02-14 DIAGNOSIS — E860 Dehydration: Secondary | ICD-10-CM | POA: Diagnosis not present

## 2015-02-14 DIAGNOSIS — W19XXXA Unspecified fall, initial encounter: Secondary | ICD-10-CM | POA: Diagnosis not present

## 2015-02-14 DIAGNOSIS — R630 Anorexia: Secondary | ICD-10-CM | POA: Diagnosis not present

## 2015-02-14 DIAGNOSIS — F1310 Sedative, hypnotic or anxiolytic abuse, uncomplicated: Secondary | ICD-10-CM | POA: Insufficient documentation

## 2015-02-14 DIAGNOSIS — I10 Essential (primary) hypertension: Secondary | ICD-10-CM | POA: Diagnosis not present

## 2015-02-14 DIAGNOSIS — Z8719 Personal history of other diseases of the digestive system: Secondary | ICD-10-CM | POA: Diagnosis not present

## 2015-02-14 DIAGNOSIS — Z87891 Personal history of nicotine dependence: Secondary | ICD-10-CM | POA: Diagnosis not present

## 2015-02-14 DIAGNOSIS — Y92009 Unspecified place in unspecified non-institutional (private) residence as the place of occurrence of the external cause: Secondary | ICD-10-CM

## 2015-02-14 DIAGNOSIS — Y9289 Other specified places as the place of occurrence of the external cause: Secondary | ICD-10-CM | POA: Insufficient documentation

## 2015-02-14 DIAGNOSIS — Z8739 Personal history of other diseases of the musculoskeletal system and connective tissue: Secondary | ICD-10-CM | POA: Insufficient documentation

## 2015-02-14 DIAGNOSIS — R42 Dizziness and giddiness: Secondary | ICD-10-CM

## 2015-02-14 DIAGNOSIS — Z79899 Other long term (current) drug therapy: Secondary | ICD-10-CM | POA: Insufficient documentation

## 2015-02-14 DIAGNOSIS — F419 Anxiety disorder, unspecified: Secondary | ICD-10-CM | POA: Insufficient documentation

## 2015-02-14 DIAGNOSIS — Z8709 Personal history of other diseases of the respiratory system: Secondary | ICD-10-CM | POA: Diagnosis not present

## 2015-02-14 DIAGNOSIS — S0003XA Contusion of scalp, initial encounter: Secondary | ICD-10-CM | POA: Insufficient documentation

## 2015-02-14 DIAGNOSIS — Z85038 Personal history of other malignant neoplasm of large intestine: Secondary | ICD-10-CM | POA: Insufficient documentation

## 2015-02-14 DIAGNOSIS — Y999 Unspecified external cause status: Secondary | ICD-10-CM | POA: Insufficient documentation

## 2015-02-14 LAB — COMPREHENSIVE METABOLIC PANEL
ALBUMIN: 3.7 g/dL (ref 3.5–5.0)
ALT: 10 U/L — AB (ref 14–54)
AST: 17 U/L (ref 15–41)
Alkaline Phosphatase: 74 U/L (ref 38–126)
Anion gap: 10 (ref 5–15)
BUN: 14 mg/dL (ref 6–20)
CHLORIDE: 106 mmol/L (ref 101–111)
CO2: 24 mmol/L (ref 22–32)
CREATININE: 0.89 mg/dL (ref 0.44–1.00)
Calcium: 9.2 mg/dL (ref 8.9–10.3)
GFR calc Af Amer: >60 mL/min (ref >60)
GFR calc non Af Amer: >60 mL/min (ref >60)
Glucose, Bld: 112 mg/dL — ABNORMAL HIGH (ref 65–99)
POTASSIUM: 3.7 mmol/L (ref 3.5–5.1)
SODIUM: 140 mmol/L (ref 135–145)
Total Bilirubin: 0.9 mg/dL (ref 0.3–1.2)
Total Protein: 6.7 g/dL (ref 6.5–8.1)

## 2015-02-14 LAB — URINALYSIS, ROUTINE W REFLEX MICROSCOPIC
BILIRUBIN URINE: NEGATIVE
Glucose, UA: NEGATIVE mg/dL
Hgb urine dipstick: NEGATIVE
Ketones, ur: 15 mg/dL — AB
Leukocytes, UA: NEGATIVE
Nitrite: NEGATIVE
PH: 6.5 (ref 5.0–8.0)
Protein, ur: NEGATIVE mg/dL
SPECIFIC GRAVITY, URINE: 1.02 (ref 1.005–1.030)
Urobilinogen, UA: 1 mg/dL (ref 0.0–1.0)

## 2015-02-14 LAB — CBG MONITORING, ED: Glucose-Capillary: 107 mg/dL — ABNORMAL HIGH (ref 65–99)

## 2015-02-14 LAB — ETHANOL

## 2015-02-14 LAB — PROTIME-INR
INR: 1.06 (ref 0.00–1.49)
Prothrombin Time: 14 seconds (ref 11.6–15.2)

## 2015-02-14 LAB — CBC
HEMATOCRIT: 46.7 % — AB (ref 36.0–46.0)
Hemoglobin: 16 g/dL — ABNORMAL HIGH (ref 12.0–15.0)
MCH: 30 pg (ref 26.0–34.0)
MCHC: 34.3 g/dL (ref 30.0–36.0)
MCV: 87.5 fL (ref 78.0–100.0)
Platelets: 231 10*3/uL (ref 150–400)
RBC: 5.34 MIL/uL — ABNORMAL HIGH (ref 3.87–5.11)
RDW: 13.4 % (ref 11.5–15.5)
WBC: 9.5 10*3/uL (ref 4.0–10.5)

## 2015-02-14 LAB — DIFFERENTIAL
BASOS ABS: 0 10*3/uL (ref 0.0–0.1)
Basophils Relative: 0 %
Eosinophils Absolute: 0.1 10*3/uL (ref 0.0–0.7)
Eosinophils Relative: 1 %
Lymphocytes Relative: 12 %
Lymphs Abs: 1.2 10*3/uL (ref 0.7–4.0)
Monocytes Absolute: 0.8 10*3/uL (ref 0.1–1.0)
Monocytes Relative: 8 %
NEUTROS ABS: 7.5 10*3/uL (ref 1.7–7.7)
NEUTROS PCT: 79 %

## 2015-02-14 LAB — RAPID URINE DRUG SCREEN, HOSP PERFORMED
AMPHETAMINES: NOT DETECTED
BARBITURATES: NOT DETECTED
Benzodiazepines: POSITIVE — AB
Cocaine: NOT DETECTED
Opiates: NOT DETECTED
Tetrahydrocannabinol: NOT DETECTED

## 2015-02-14 LAB — I-STAT TROPONIN, ED
Troponin i, poc: 0.02 ng/mL (ref 0.00–0.08)
Troponin i, poc: 0.04 ng/mL (ref 0.00–0.08)

## 2015-02-14 LAB — APTT: APTT: 27 s (ref 24–37)

## 2015-02-14 MED ORDER — SODIUM CHLORIDE 0.9 % IV BOLUS (SEPSIS)
1000.0000 mL | Freq: Once | INTRAVENOUS | Status: AC
  Administered 2015-02-14: 1000 mL via INTRAVENOUS

## 2015-02-14 NOTE — ED Notes (Signed)
MD at bedside. 

## 2015-02-14 NOTE — Discharge Instructions (Signed)
Stay hydrated.   Eat regularly.   See your doctor.   Return to ER if you have another fall, passing out, chest pain, shortness of breath, vomiting, thoughts of harming yourself or others.

## 2015-02-14 NOTE — ED Notes (Addendum)
Pt arrives from, home via [facility] c/o dizziness, weakness and multiple falls.  Pt is AOx3, disoriented to time.  Pt reports being so weak and dizzy pt was unable to remain standing.  Contusion noted to L posterior scalp.  Pt denies LOC.  Pt denies dysuria.  Pt denies pain.  NAD noted, resp e/u.

## 2015-02-14 NOTE — ED Notes (Addendum)
Patient transported to XR. 

## 2015-02-14 NOTE — ED Notes (Signed)
Pt was able to ambulate to restroom without assistance.

## 2015-02-14 NOTE — ED Provider Notes (Signed)
CSN: [id number]     Arrival date & time 02/14/15  1927 History   First MD Initiated Contact with Patient 02/14/15 1946     Chief Complaint  Patient presents with  . Weakness  . Fall     (Consider location/radiation/quality/duration/timing/severity/associated sxs/prior Treatment) The history is provided by the patient.  [name] Barron is a 81 y.o. female history depression, anxiety, lung nodule here presenting with fall. She went to bed around 5:00 pm and around 6:00pm she woke up and feels lightheaded and dizzy. Also feels diffuse weakness. She then fell a couple times and hit the left side of her scalp. Denies passing out. Hasn't been eating much. Lives at home and brought in by EMS. God son came later and states that patient is depressed and didn't eat much when she is depressed. She states that she is chronically depressed but denies suicidal. She just has no appetite today and only ate very little food but denies vomiting.    Level V caveat- condition of patient.   Past Medical History  Diagnosis Date  . Anxiety   . Depression   . Hypertension   . Diverticulosis of colon   . Tubular adenoma of colon     Unclear history. Unclear as to when the colonoscopy was done. Appeared she was referred to GI for a repeat colonoscopy in 2009. No records in EMR. Positive hemoccult in 1997  . Chronic sinusitis   . Inadequate material resources   . Osteopenia     DEXA in 2009   Past Surgical History  Procedure Laterality Date  . Abdominal hysterectomy  1970s   No family history on file. Social History  Substance Use Topics  . Smoking status: Never Smoker   . Smokeless tobacco: Never Used  . Alcohol Use: No   OB History    No data available     Review of Systems  Neurological: Positive for dizziness and weakness.  All other systems reviewed and are negative.     Allergies  Ace inhibitors  Home Medications   Prior to Admission medications   Medication Sig Start Date End  Date Taking? Authorizing Provider  Calcium Carb-Cholecalciferol 600-400 MG-UNIT TABS Take 1 tablet by mouth 2 (two) times daily.    Yes Historical Provider, MD  cetirizine (ALL DAY ALLERGY) 10 MG tablet Take 10 mg by mouth daily.   Yes Historical Provider, MD  clonazePAM (KLONOPIN) 0.5 MG tablet Take 0.5 mg by mouth daily as needed for anxiety.   Yes Historical Provider, MD  omeprazole (PRILOSEC) 20 MG capsule Take 20 mg by mouth daily.   Yes Historical Provider, MD   BP 175/103 mmHg  Pulse 102  Temp(Src) 98.7 F (37.1 C)  Resp 23  Ht '5\' 2"'$  (1.575 m)  Wt 125 lb (56.7 kg)  BMI 22.86 kg/m2  SpO2 94%  LMP 05/04/2011 Physical Exam  Constitutional: She is oriented to person, place, and time.  Disheveled   HENT:  Head: Normocephalic.  Mouth/Throat: Oropharynx is clear and moist.  L scalp hematoma   Eyes: Conjunctivae are normal. Pupils are equal, round, and reactive to light.  No nystagmus.   Neck: Normal range of motion. Neck supple.  Cardiovascular: Normal rate, regular rhythm and normal heart sounds.   Pulmonary/Chest: Effort normal and breath sounds normal. No respiratory distress. She has no wheezes. She has no rales.  Abdominal: Soft. Bowel sounds are normal. She exhibits no distension. There is no tenderness. There is no rebound.  Musculoskeletal: Normal range  of motion. She exhibits no edema or tenderness.  No spinal tenderness. Nl ROM bilateral hips   Neurological: She is alert and oriented to person, place, and time. No cranial nerve deficit. Coordination normal.  Skin: Skin is warm and dry.  Psychiatric: She has a normal mood and affect. Her behavior is normal. Thought content normal.  Nursing note and vitals reviewed.   ED Course  Procedures (including critical care time) Labs Review Labs Reviewed  URINALYSIS, ROUTINE W REFLEX MICROSCOPIC (NOT AT [facility]) - Abnormal; Notable for the following:    APPearance CLOUDY (*)    Ketones, ur 15 (*)    All other components within  normal limits  CBC - Abnormal; Notable for the following:    RBC 5.34 (*)    Hemoglobin 16.0 (*)    HCT 46.7 (*)    All other components within normal limits  COMPREHENSIVE METABOLIC PANEL - Abnormal; Notable for the following:    Glucose, Bld 112 (*)    ALT 10 (*)    All other components within normal limits  URINE RAPID DRUG SCREEN, HOSP PERFORMED - Abnormal; Notable for the following:    Benzodiazepines POSITIVE (*)    All other components within normal limits  CBG MONITORING, ED - Abnormal; Notable for the following:    Glucose-Capillary 107 (*)    All other components within normal limits  ETHANOL  PROTIME-INR  APTT  DIFFERENTIAL  I-STAT CHEM 8, ED  I-STAT TROPOININ, ED  Randolm Idol, ED    Imaging Review Dg Chest 2 View  02/14/2015   CLINICAL DATA:  Fall this evening with chest and pelvic pain  EXAM: CHEST - 2 VIEW  COMPARISON:  10/14/2011  FINDINGS: Cardiac shadow is within normal limits. The lungs are well aerated bilaterally. Mild aortic calcifications are seen. No acute bony abnormality is noted. Degenerative changes about shoulder joints are seen.  IMPRESSION: No acute abnormality noted.   Electronically Signed   By: Inez Catalina M.D.   On: 02/14/2015 20:58   Dg Pelvis 1-2 Views  02/14/2015   CLINICAL DATA:  Fall this evening, now with pelvic pain. Weakness and dizziness.  EXAM: PELVIS - 1-2 VIEW  COMPARISON:  None.  FINDINGS: The bones are under mineralized. The cortical margins of the bony pelvis are intact. No fracture. Pubic symphysis and sacroiliac joints are congruent. Both femoral heads are well-seated in the respective acetabula.  IMPRESSION: No fracture or subluxation of the pelvis.   Electronically Signed   By: Jeb Levering M.D.   On: 02/14/2015 20:58   Ct Head Wo Contrast  02/14/2015   CLINICAL DATA:  Multiple falls today with headache and neck pain, initial encounter  EXAM: CT HEAD WITHOUT CONTRAST  CT CERVICAL SPINE WITHOUT CONTRAST  TECHNIQUE:  Multidetector CT imaging of the head and cervical spine was performed following the standard protocol without intravenous contrast. Multiplanar CT image reconstructions of the cervical spine were also generated.  COMPARISON:  None.  FINDINGS: CT HEAD FINDINGS  Bony calvarium is intact. Mild atrophic changes and chronic white matter ischemic changes are seen. No findings to suggest acute hemorrhage, acute infarction or space-occupying mass lesion are noted.  CT CERVICAL SPINE FINDINGS  Seven cervical segments are well visualized. Vertebral body height is well maintained. No acute fracture or acute facet abnormality is noted. Facet hypertrophic changes and osteophytic changes are noted at multiple levels. The visualized lung apices are within normal limits. Mild heterogeneity of the thyroid is noted without discrete nodule. The  remainder the soft tissue structures are within normal limits.  IMPRESSION: CT of the head: Chronic atrophic and ischemic changes without acute abnormality.  CT of the cervical spine multilevel degenerative change without acute abnormality.   Electronically Signed   By: Inez Catalina M.D.   On: 02/14/2015 21:11   Ct Cervical Spine Wo Contrast  02/14/2015   CLINICAL DATA:  Multiple falls today with headache and neck pain, initial encounter  EXAM: CT HEAD WITHOUT CONTRAST  CT CERVICAL SPINE WITHOUT CONTRAST  TECHNIQUE: Multidetector CT imaging of the head and cervical spine was performed following the standard protocol without intravenous contrast. Multiplanar CT image reconstructions of the cervical spine were also generated.  COMPARISON:  None.  FINDINGS: CT HEAD FINDINGS  Bony calvarium is intact. Mild atrophic changes and chronic white matter ischemic changes are seen. No findings to suggest acute hemorrhage, acute infarction or space-occupying mass lesion are noted.  CT CERVICAL SPINE FINDINGS  Seven cervical segments are well visualized. Vertebral body height is well maintained. No acute  fracture or acute facet abnormality is noted. Facet hypertrophic changes and osteophytic changes are noted at multiple levels. The visualized lung apices are within normal limits. Mild heterogeneity of the thyroid is noted without discrete nodule. The remainder the soft tissue structures are within normal limits.  IMPRESSION: CT of the head: Chronic atrophic and ischemic changes without acute abnormality.  CT of the cervical spine multilevel degenerative change without acute abnormality.   Electronically Signed   By: Inez Catalina M.D.   On: 02/14/2015 21:11   I have personally reviewed and evaluated these images and lab results as part of my medical decision-making.   EKG Interpretation   Date/Time:  Thursday February 14 2015 19:34:44 EDT Ventricular Rate:  93 PR Interval:  142 QRS Duration: 129 QT Interval:  369 QTC Calculation: 459 R Axis:   58 Text Interpretation:  Sinus rhythm Right bundle branch block No  significant change since last tracing Confirmed by YAO  MD, DAVID ([id number])  on 02/14/2015 7:57:46 PM      MDM   Final diagnoses:  None    Cydnee Fuquay is a 81 y.o. female here with weakness, fall. Consider dehydration vs UTI vs pneumonia. Nonfocal neuro exam, I doubt posterior stroke. Will get labs, CT head/neck, xrays, UA. Will get orthostatics and reassess.   11:06 PM Patient orthostatic initially. Some ketone in the urine. Likely mild dehydration. Given 1L NS and food and able to ambulate with no falls. Feels steady. Denies suicidal ideations. CT head/neck, xray and labs at baseline. Will dc home with family.    Wandra Arthurs, MD 02/14/15 [phone]

## 2015-07-17 DEATH — deceased
